# Patient Record
Sex: Female | Born: 1970 | Race: White | Hispanic: No | Marital: Married | Smoking: Current every day smoker
Health system: Southern US, Community
[De-identification: ages and names within clinical notes are randomized; demographics above are authoritative.]

## PROBLEM LIST (undated history)

## (undated) DIAGNOSIS — M1990 Unspecified osteoarthritis, unspecified site: Secondary | ICD-10-CM

## (undated) DIAGNOSIS — G8929 Other chronic pain: Secondary | ICD-10-CM

## (undated) DIAGNOSIS — G43119 Migraine with aura, intractable, without status migrainosus: Principal | ICD-10-CM

## (undated) DIAGNOSIS — Z9289 Personal history of other medical treatment: Secondary | ICD-10-CM

## (undated) DIAGNOSIS — I10 Essential (primary) hypertension: Secondary | ICD-10-CM

## (undated) DIAGNOSIS — K219 Gastro-esophageal reflux disease without esophagitis: Secondary | ICD-10-CM

## (undated) DIAGNOSIS — J189 Pneumonia, unspecified organism: Secondary | ICD-10-CM

## (undated) DIAGNOSIS — R3915 Urgency of urination: Secondary | ICD-10-CM

## (undated) DIAGNOSIS — F319 Bipolar disorder, unspecified: Secondary | ICD-10-CM

## (undated) DIAGNOSIS — G43009 Migraine without aura, not intractable, without status migrainosus: Secondary | ICD-10-CM

## (undated) DIAGNOSIS — M797 Fibromyalgia: Secondary | ICD-10-CM

## (undated) DIAGNOSIS — R51 Headache: Secondary | ICD-10-CM

## (undated) DIAGNOSIS — E669 Obesity, unspecified: Secondary | ICD-10-CM

## (undated) DIAGNOSIS — J329 Chronic sinusitis, unspecified: Secondary | ICD-10-CM

## (undated) DIAGNOSIS — G4733 Obstructive sleep apnea (adult) (pediatric): Secondary | ICD-10-CM

## (undated) DIAGNOSIS — D649 Anemia, unspecified: Secondary | ICD-10-CM

## (undated) DIAGNOSIS — N390 Urinary tract infection, site not specified: Secondary | ICD-10-CM

## (undated) DIAGNOSIS — E119 Type 2 diabetes mellitus without complications: Secondary | ICD-10-CM

## (undated) DIAGNOSIS — R7303 Prediabetes: Secondary | ICD-10-CM

## (undated) DIAGNOSIS — R413 Other amnesia: Secondary | ICD-10-CM

## (undated) DIAGNOSIS — J45909 Unspecified asthma, uncomplicated: Secondary | ICD-10-CM

## (undated) DIAGNOSIS — T8859XA Other complications of anesthesia, initial encounter: Secondary | ICD-10-CM

## (undated) DIAGNOSIS — R519 Headache, unspecified: Secondary | ICD-10-CM

## (undated) DIAGNOSIS — F419 Anxiety disorder, unspecified: Secondary | ICD-10-CM

## (undated) DIAGNOSIS — K5909 Other constipation: Secondary | ICD-10-CM

## (undated) DIAGNOSIS — T4145XA Adverse effect of unspecified anesthetic, initial encounter: Secondary | ICD-10-CM

## (undated) DIAGNOSIS — R339 Retention of urine, unspecified: Secondary | ICD-10-CM

## (undated) DIAGNOSIS — E7800 Pure hypercholesterolemia, unspecified: Secondary | ICD-10-CM

## (undated) DIAGNOSIS — M549 Dorsalgia, unspecified: Secondary | ICD-10-CM

## (undated) DIAGNOSIS — J42 Unspecified chronic bronchitis: Secondary | ICD-10-CM

## (undated) DIAGNOSIS — I209 Angina pectoris, unspecified: Secondary | ICD-10-CM

## (undated) DIAGNOSIS — R32 Unspecified urinary incontinence: Secondary | ICD-10-CM

## (undated) HISTORY — PX: FRACTURE SURGERY: SHX138

## (undated) HISTORY — DX: Obstructive sleep apnea (adult) (pediatric): G47.33

## (undated) HISTORY — PX: STRABISMUS SURGERY: SHX218

## (undated) HISTORY — PX: MULTIPLE TOOTH EXTRACTIONS: SHX2053

## (undated) HISTORY — DX: Gastro-esophageal reflux disease without esophagitis: K21.9

## (undated) HISTORY — DX: Migraine without aura, not intractable, without status migrainosus: G43.009

## (undated) HISTORY — DX: Obesity, unspecified: E66.9

## (undated) HISTORY — DX: Unspecified asthma, uncomplicated: J45.909

## (undated) HISTORY — PX: SHOULDER SURGERY: SHX246

## (undated) HISTORY — PX: FOOT SURGERY: SHX648

## (undated) HISTORY — DX: Migraine with aura, intractable, without status migrainosus: G43.119

## (undated) HISTORY — PX: CARPAL TUNNEL RELEASE: SHX101

## (undated) HISTORY — PX: KNEE ARTHROSCOPY: SHX127

## (undated) HISTORY — PX: ORIF CLAVICULAR FRACTURE: SHX5055

---
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
## 1990-11-24 HISTORY — PX: TUBAL LIGATION: SHX77

## 1998-04-23 ENCOUNTER — Encounter: Admission: RE | Admit: 1998-04-23 | Discharge: 1998-04-23 | Payer: Self-pay | Admitting: Family Medicine

## 1998-05-07 ENCOUNTER — Encounter: Admission: RE | Admit: 1998-05-07 | Discharge: 1998-05-07 | Payer: Self-pay | Admitting: Family Medicine

## 1998-05-08 ENCOUNTER — Encounter: Admission: RE | Admit: 1998-05-08 | Discharge: 1998-05-08 | Payer: Self-pay | Admitting: Sports Medicine

## 1998-05-16 ENCOUNTER — Ambulatory Visit (HOSPITAL_COMMUNITY): Admission: RE | Admit: 1998-05-16 | Discharge: 1998-05-16 | Payer: Self-pay | Admitting: Gastroenterology

## 1998-06-05 ENCOUNTER — Emergency Department (HOSPITAL_COMMUNITY): Admission: EM | Admit: 1998-06-05 | Discharge: 1998-06-05 | Payer: Self-pay | Admitting: Emergency Medicine

## 1998-12-16 ENCOUNTER — Emergency Department (HOSPITAL_COMMUNITY): Admission: EM | Admit: 1998-12-16 | Discharge: 1998-12-16 | Payer: Self-pay | Admitting: *Deleted

## 1999-09-30 ENCOUNTER — Emergency Department (HOSPITAL_COMMUNITY): Admission: EM | Admit: 1999-09-30 | Discharge: 1999-09-30 | Payer: Self-pay | Admitting: Emergency Medicine

## 2000-04-21 ENCOUNTER — Emergency Department (HOSPITAL_COMMUNITY): Admission: EM | Admit: 2000-04-21 | Discharge: 2000-04-21 | Payer: Self-pay | Admitting: Emergency Medicine

## 2000-04-24 ENCOUNTER — Emergency Department (HOSPITAL_COMMUNITY): Admission: EM | Admit: 2000-04-24 | Discharge: 2000-04-24 | Payer: Self-pay | Admitting: Emergency Medicine

## 2000-05-01 ENCOUNTER — Emergency Department (HOSPITAL_COMMUNITY): Admission: EM | Admit: 2000-05-01 | Discharge: 2000-05-01 | Payer: Self-pay | Admitting: Emergency Medicine

## 2000-06-03 ENCOUNTER — Emergency Department (HOSPITAL_COMMUNITY): Admission: EM | Admit: 2000-06-03 | Discharge: 2000-06-03 | Payer: Self-pay | Admitting: Emergency Medicine

## 2000-06-24 ENCOUNTER — Emergency Department (HOSPITAL_COMMUNITY): Admission: EM | Admit: 2000-06-24 | Discharge: 2000-06-24 | Payer: Self-pay | Admitting: *Deleted

## 2000-06-24 ENCOUNTER — Encounter: Payer: Self-pay | Admitting: *Deleted

## 2000-07-25 HISTORY — PX: UVULOPALATOPHARYNGOPLASTY: SHX827

## 2000-09-10 ENCOUNTER — Encounter: Admission: RE | Admit: 2000-09-10 | Discharge: 2000-09-10 | Payer: Self-pay | Admitting: Otolaryngology

## 2000-09-10 ENCOUNTER — Encounter: Payer: Self-pay | Admitting: Otolaryngology

## 2000-11-03 ENCOUNTER — Emergency Department (HOSPITAL_COMMUNITY): Admission: EM | Admit: 2000-11-03 | Discharge: 2000-11-03 | Payer: Self-pay | Admitting: Emergency Medicine

## 2000-11-24 HISTORY — PX: LAPAROSCOPIC CHOLECYSTECTOMY: SUR755

## 2001-01-21 ENCOUNTER — Emergency Department (HOSPITAL_COMMUNITY): Admission: EM | Admit: 2001-01-21 | Discharge: 2001-01-21 | Payer: Self-pay | Admitting: Emergency Medicine

## 2001-01-21 ENCOUNTER — Encounter: Payer: Self-pay | Admitting: Emergency Medicine

## 2001-01-28 ENCOUNTER — Encounter: Admission: RE | Admit: 2001-01-28 | Discharge: 2001-03-30 | Payer: Self-pay | Admitting: Orthopedic Surgery

## 2001-03-09 ENCOUNTER — Ambulatory Visit (HOSPITAL_COMMUNITY): Admission: RE | Admit: 2001-03-09 | Discharge: 2001-03-09 | Payer: Self-pay | Admitting: Orthopedic Surgery

## 2001-03-09 ENCOUNTER — Encounter: Payer: Self-pay | Admitting: Orthopedic Surgery

## 2001-03-16 ENCOUNTER — Ambulatory Visit (HOSPITAL_BASED_OUTPATIENT_CLINIC_OR_DEPARTMENT_OTHER): Admission: RE | Admit: 2001-03-16 | Discharge: 2001-03-16 | Payer: Self-pay | Admitting: Otolaryngology

## 2001-05-13 ENCOUNTER — Encounter: Admission: RE | Admit: 2001-05-13 | Discharge: 2001-07-24 | Payer: Self-pay | Admitting: Anesthesiology

## 2001-06-01 ENCOUNTER — Encounter: Payer: Self-pay | Admitting: Otolaryngology

## 2001-06-03 ENCOUNTER — Encounter (INDEPENDENT_AMBULATORY_CARE_PROVIDER_SITE_OTHER): Payer: Self-pay | Admitting: Specialist

## 2001-06-03 ENCOUNTER — Ambulatory Visit (HOSPITAL_COMMUNITY): Admission: RE | Admit: 2001-06-03 | Discharge: 2001-06-04 | Payer: Self-pay | Admitting: Otolaryngology

## 2001-06-03 ENCOUNTER — Encounter: Payer: Self-pay | Admitting: Otolaryngology

## 2001-06-15 ENCOUNTER — Emergency Department (HOSPITAL_COMMUNITY): Admission: EM | Admit: 2001-06-15 | Discharge: 2001-06-15 | Payer: Self-pay | Admitting: Emergency Medicine

## 2001-08-18 ENCOUNTER — Emergency Department (HOSPITAL_COMMUNITY): Admission: EM | Admit: 2001-08-18 | Discharge: 2001-08-18 | Payer: Self-pay | Admitting: Emergency Medicine

## 2001-08-18 ENCOUNTER — Encounter: Payer: Self-pay | Admitting: Emergency Medicine

## 2001-12-01 ENCOUNTER — Encounter: Admission: RE | Admit: 2001-12-01 | Discharge: 2001-12-01 | Payer: Self-pay | Admitting: Orthopedic Surgery

## 2001-12-01 ENCOUNTER — Encounter: Payer: Self-pay | Admitting: Orthopedic Surgery

## 2001-12-07 ENCOUNTER — Encounter: Admission: RE | Admit: 2001-12-07 | Discharge: 2002-01-12 | Payer: Self-pay | Admitting: Orthopedic Surgery

## 2002-05-19 ENCOUNTER — Encounter: Payer: Self-pay | Admitting: Gastroenterology

## 2002-05-19 ENCOUNTER — Encounter: Admission: RE | Admit: 2002-05-19 | Discharge: 2002-05-19 | Payer: Self-pay | Admitting: Gastroenterology

## 2002-06-06 ENCOUNTER — Emergency Department (HOSPITAL_COMMUNITY): Admission: EM | Admit: 2002-06-06 | Discharge: 2002-06-06 | Payer: Self-pay | Admitting: Emergency Medicine

## 2002-06-06 ENCOUNTER — Encounter: Payer: Self-pay | Admitting: *Deleted

## 2002-06-15 ENCOUNTER — Encounter: Payer: Self-pay | Admitting: Surgery

## 2002-06-15 ENCOUNTER — Encounter: Admission: RE | Admit: 2002-06-15 | Discharge: 2002-06-15 | Payer: Self-pay | Admitting: Surgery

## 2002-08-26 ENCOUNTER — Emergency Department (HOSPITAL_COMMUNITY): Admission: EM | Admit: 2002-08-26 | Discharge: 2002-08-26 | Payer: Self-pay | Admitting: Emergency Medicine

## 2002-08-30 ENCOUNTER — Encounter: Payer: Self-pay | Admitting: Emergency Medicine

## 2002-08-30 ENCOUNTER — Emergency Department (HOSPITAL_COMMUNITY): Admission: EM | Admit: 2002-08-30 | Discharge: 2002-08-30 | Payer: Self-pay | Admitting: Emergency Medicine

## 2002-09-21 ENCOUNTER — Encounter: Payer: Self-pay | Admitting: Emergency Medicine

## 2002-09-21 ENCOUNTER — Emergency Department (HOSPITAL_COMMUNITY): Admission: EM | Admit: 2002-09-21 | Discharge: 2002-09-21 | Payer: Self-pay | Admitting: Emergency Medicine

## 2002-10-25 ENCOUNTER — Encounter: Payer: Self-pay | Admitting: Emergency Medicine

## 2002-10-25 ENCOUNTER — Emergency Department (HOSPITAL_COMMUNITY): Admission: EM | Admit: 2002-10-25 | Discharge: 2002-10-25 | Payer: Self-pay | Admitting: Emergency Medicine

## 2002-11-26 ENCOUNTER — Emergency Department (HOSPITAL_COMMUNITY): Admission: EM | Admit: 2002-11-26 | Discharge: 2002-11-26 | Payer: Self-pay | Admitting: Emergency Medicine

## 2002-11-29 ENCOUNTER — Encounter: Payer: Self-pay | Admitting: Surgery

## 2002-11-29 ENCOUNTER — Encounter: Admission: RE | Admit: 2002-11-29 | Discharge: 2002-11-29 | Payer: Self-pay | Admitting: Surgery

## 2002-12-06 ENCOUNTER — Encounter: Payer: Self-pay | Admitting: Surgery

## 2002-12-06 ENCOUNTER — Encounter: Admission: RE | Admit: 2002-12-06 | Discharge: 2002-12-06 | Payer: Self-pay | Admitting: Surgery

## 2003-01-31 ENCOUNTER — Ambulatory Visit (HOSPITAL_COMMUNITY): Admission: RE | Admit: 2003-01-31 | Discharge: 2003-01-31 | Payer: Self-pay | Admitting: Gastroenterology

## 2003-07-07 ENCOUNTER — Encounter (INDEPENDENT_AMBULATORY_CARE_PROVIDER_SITE_OTHER): Payer: Self-pay | Admitting: Specialist

## 2003-07-07 ENCOUNTER — Observation Stay (HOSPITAL_COMMUNITY): Admission: RE | Admit: 2003-07-07 | Discharge: 2003-07-08 | Payer: Self-pay | Admitting: *Deleted

## 2003-07-07 ENCOUNTER — Encounter: Payer: Self-pay | Admitting: Surgery

## 2003-07-22 ENCOUNTER — Emergency Department (HOSPITAL_COMMUNITY): Admission: EM | Admit: 2003-07-22 | Discharge: 2003-07-22 | Payer: Self-pay

## 2003-09-25 ENCOUNTER — Emergency Department (HOSPITAL_COMMUNITY): Admission: AD | Admit: 2003-09-25 | Discharge: 2003-09-25 | Payer: Self-pay | Admitting: Family Medicine

## 2003-10-10 ENCOUNTER — Emergency Department (HOSPITAL_COMMUNITY): Admission: AD | Admit: 2003-10-10 | Discharge: 2003-10-10 | Payer: Self-pay | Admitting: Family Medicine

## 2003-10-30 ENCOUNTER — Encounter: Admission: RE | Admit: 2003-10-30 | Discharge: 2003-10-30 | Payer: Self-pay | Admitting: Allergy and Immunology

## 2004-03-01 ENCOUNTER — Emergency Department (HOSPITAL_COMMUNITY): Admission: EM | Admit: 2004-03-01 | Discharge: 2004-03-01 | Payer: Self-pay | Admitting: *Deleted

## 2004-09-09 ENCOUNTER — Emergency Department (HOSPITAL_COMMUNITY): Admission: EM | Admit: 2004-09-09 | Discharge: 2004-09-09 | Payer: Self-pay | Admitting: Emergency Medicine

## 2005-02-07 ENCOUNTER — Emergency Department (HOSPITAL_COMMUNITY): Admission: EM | Admit: 2005-02-07 | Discharge: 2005-02-07 | Payer: Self-pay | Admitting: Family Medicine

## 2005-03-09 ENCOUNTER — Emergency Department (HOSPITAL_COMMUNITY): Admission: AD | Admit: 2005-03-09 | Discharge: 2005-03-09 | Payer: Self-pay | Admitting: Family Medicine

## 2005-03-16 ENCOUNTER — Emergency Department (HOSPITAL_COMMUNITY): Admission: EM | Admit: 2005-03-16 | Discharge: 2005-03-16 | Payer: Self-pay | Admitting: Family Medicine

## 2005-04-16 ENCOUNTER — Encounter: Admission: RE | Admit: 2005-04-16 | Discharge: 2005-04-16 | Payer: Self-pay | Admitting: *Deleted

## 2005-06-10 ENCOUNTER — Emergency Department (HOSPITAL_COMMUNITY): Admission: EM | Admit: 2005-06-10 | Discharge: 2005-06-10 | Payer: Self-pay | Admitting: Emergency Medicine

## 2005-08-08 ENCOUNTER — Encounter: Admission: RE | Admit: 2005-08-08 | Discharge: 2005-08-08 | Payer: Self-pay | Admitting: Allergy and Immunology

## 2005-08-23 ENCOUNTER — Emergency Department (HOSPITAL_COMMUNITY): Admission: EM | Admit: 2005-08-23 | Discharge: 2005-08-23 | Payer: Self-pay | Admitting: Emergency Medicine

## 2005-09-21 ENCOUNTER — Emergency Department (HOSPITAL_COMMUNITY): Admission: EM | Admit: 2005-09-21 | Discharge: 2005-09-21 | Payer: Self-pay | Admitting: Emergency Medicine

## 2005-10-18 ENCOUNTER — Emergency Department (HOSPITAL_COMMUNITY): Admission: EM | Admit: 2005-10-18 | Discharge: 2005-10-18 | Payer: Self-pay | Admitting: Emergency Medicine

## 2005-12-19 ENCOUNTER — Emergency Department (HOSPITAL_COMMUNITY): Admission: EM | Admit: 2005-12-19 | Discharge: 2005-12-19 | Payer: Self-pay | Admitting: Family Medicine

## 2006-04-27 ENCOUNTER — Emergency Department (HOSPITAL_COMMUNITY): Admission: EM | Admit: 2006-04-27 | Discharge: 2006-04-27 | Payer: Self-pay | Admitting: Emergency Medicine

## 2006-04-29 ENCOUNTER — Encounter (INDEPENDENT_AMBULATORY_CARE_PROVIDER_SITE_OTHER): Payer: Self-pay | Admitting: *Deleted

## 2006-04-29 ENCOUNTER — Ambulatory Visit: Payer: Self-pay | Admitting: Internal Medicine

## 2006-04-29 ENCOUNTER — Inpatient Hospital Stay (HOSPITAL_COMMUNITY): Admission: EM | Admit: 2006-04-29 | Discharge: 2006-05-06 | Payer: Self-pay | Admitting: Emergency Medicine

## 2006-04-30 ENCOUNTER — Encounter (INDEPENDENT_AMBULATORY_CARE_PROVIDER_SITE_OTHER): Payer: Self-pay | Admitting: *Deleted

## 2006-05-04 ENCOUNTER — Encounter: Payer: Self-pay | Admitting: Internal Medicine

## 2006-07-29 ENCOUNTER — Emergency Department (HOSPITAL_COMMUNITY): Admission: EM | Admit: 2006-07-29 | Discharge: 2006-07-29 | Payer: Self-pay | Admitting: Emergency Medicine

## 2006-08-18 ENCOUNTER — Emergency Department (HOSPITAL_COMMUNITY): Admission: EM | Admit: 2006-08-18 | Discharge: 2006-08-18 | Payer: Self-pay | Admitting: Family Medicine

## 2006-10-11 ENCOUNTER — Emergency Department (HOSPITAL_COMMUNITY): Admission: EM | Admit: 2006-10-11 | Discharge: 2006-10-11 | Payer: Self-pay | Admitting: Emergency Medicine

## 2006-10-13 ENCOUNTER — Ambulatory Visit: Payer: Self-pay | Admitting: Internal Medicine

## 2006-10-20 ENCOUNTER — Ambulatory Visit: Payer: Self-pay | Admitting: Internal Medicine

## 2006-10-27 ENCOUNTER — Ambulatory Visit: Admission: RE | Admit: 2006-10-27 | Discharge: 2006-10-27 | Payer: Self-pay | Admitting: Internal Medicine

## 2006-10-27 ENCOUNTER — Encounter (INDEPENDENT_AMBULATORY_CARE_PROVIDER_SITE_OTHER): Payer: Self-pay | Admitting: *Deleted

## 2006-10-27 ENCOUNTER — Ambulatory Visit: Payer: Self-pay | Admitting: Internal Medicine

## 2006-12-01 ENCOUNTER — Ambulatory Visit: Payer: Self-pay | Admitting: Internal Medicine

## 2007-08-13 ENCOUNTER — Ambulatory Visit: Payer: Self-pay | Admitting: Internal Medicine

## 2008-08-09 ENCOUNTER — Emergency Department (HOSPITAL_COMMUNITY): Admission: EM | Admit: 2008-08-09 | Discharge: 2008-08-09 | Payer: Self-pay | Admitting: Emergency Medicine

## 2008-11-05 ENCOUNTER — Emergency Department (HOSPITAL_COMMUNITY): Admission: EM | Admit: 2008-11-05 | Discharge: 2008-11-05 | Payer: Self-pay | Admitting: Emergency Medicine

## 2009-04-04 ENCOUNTER — Encounter: Admission: RE | Admit: 2009-04-04 | Discharge: 2009-05-04 | Payer: Self-pay | Admitting: Orthopedic Surgery

## 2009-04-25 ENCOUNTER — Encounter: Admission: RE | Admit: 2009-04-25 | Discharge: 2009-04-25 | Payer: Self-pay | Admitting: Nephrology

## 2009-04-26 ENCOUNTER — Emergency Department (HOSPITAL_COMMUNITY): Admission: EM | Admit: 2009-04-26 | Discharge: 2009-04-26 | Payer: Self-pay | Admitting: Emergency Medicine

## 2009-05-28 ENCOUNTER — Emergency Department (HOSPITAL_COMMUNITY): Admission: EM | Admit: 2009-05-28 | Discharge: 2009-05-28 | Payer: Self-pay | Admitting: Emergency Medicine

## 2009-06-01 ENCOUNTER — Encounter
Admission: RE | Admit: 2009-06-01 | Discharge: 2009-06-05 | Payer: Self-pay | Admitting: Physical Medicine & Rehabilitation

## 2009-06-05 ENCOUNTER — Ambulatory Visit: Payer: Self-pay | Admitting: Physical Medicine & Rehabilitation

## 2009-06-18 ENCOUNTER — Emergency Department (HOSPITAL_COMMUNITY): Admission: EM | Admit: 2009-06-18 | Discharge: 2009-06-18 | Payer: Self-pay | Admitting: Emergency Medicine

## 2009-07-10 ENCOUNTER — Encounter: Admission: RE | Admit: 2009-07-10 | Discharge: 2009-07-10 | Payer: Self-pay | Admitting: Nephrology

## 2009-10-22 ENCOUNTER — Ambulatory Visit (HOSPITAL_BASED_OUTPATIENT_CLINIC_OR_DEPARTMENT_OTHER): Admission: RE | Admit: 2009-10-22 | Discharge: 2009-10-22 | Payer: Self-pay | Admitting: Orthopedic Surgery

## 2009-12-21 ENCOUNTER — Ambulatory Visit (HOSPITAL_BASED_OUTPATIENT_CLINIC_OR_DEPARTMENT_OTHER): Admission: RE | Admit: 2009-12-21 | Discharge: 2009-12-21 | Payer: Self-pay | Admitting: Orthopedic Surgery

## 2010-01-01 ENCOUNTER — Encounter: Admission: RE | Admit: 2010-01-01 | Discharge: 2010-01-01 | Payer: Self-pay | Admitting: Neurology

## 2010-01-01 ENCOUNTER — Encounter: Admission: RE | Admit: 2010-01-01 | Discharge: 2010-01-01 | Payer: Self-pay | Admitting: Allergy and Immunology

## 2010-04-24 ENCOUNTER — Encounter: Admission: RE | Admit: 2010-04-24 | Discharge: 2010-04-24 | Payer: Self-pay | Admitting: Allergy and Immunology

## 2010-07-01 ENCOUNTER — Emergency Department (HOSPITAL_COMMUNITY): Admission: EM | Admit: 2010-07-01 | Discharge: 2010-07-02 | Payer: Self-pay | Admitting: Emergency Medicine

## 2010-12-15 ENCOUNTER — Encounter: Payer: Self-pay | Admitting: Allergy and Immunology

## 2011-01-21 ENCOUNTER — Ambulatory Visit: Payer: Medicaid Other | Admitting: Physical Therapy

## 2011-01-22 ENCOUNTER — Ambulatory Visit: Payer: Medicaid Other | Admitting: Physical Therapy

## 2011-01-30 ENCOUNTER — Ambulatory Visit: Payer: Medicaid Other | Attending: Nephrology | Admitting: Physical Therapy

## 2011-01-30 DIAGNOSIS — IMO0001 Reserved for inherently not codable concepts without codable children: Secondary | ICD-10-CM | POA: Insufficient documentation

## 2011-01-30 DIAGNOSIS — M545 Low back pain, unspecified: Secondary | ICD-10-CM | POA: Insufficient documentation

## 2011-02-09 LAB — POCT I-STAT, CHEM 8
BUN: 6 mg/dL (ref 6–23)
Calcium, Ion: 1.1 mmol/L — ABNORMAL LOW (ref 1.12–1.32)
Chloride: 101 mEq/L (ref 96–112)
Creatinine, Ser: 0.8 mg/dL (ref 0.4–1.2)
Glucose, Bld: 150 mg/dL — ABNORMAL HIGH (ref 70–99)
HCT: 42 % (ref 36.0–46.0)
Hemoglobin: 14.3 g/dL (ref 12.0–15.0)
Potassium: 3.3 mEq/L — ABNORMAL LOW (ref 3.5–5.1)
Sodium: 139 mEq/L (ref 135–145)
TCO2: 32 mmol/L (ref 0–100)

## 2011-02-26 LAB — POCT I-STAT, CHEM 8
BUN: 9 mg/dL (ref 6–23)
Calcium, Ion: 1.21 mmol/L (ref 1.12–1.32)
Chloride: 98 mEq/L (ref 96–112)
Creatinine, Ser: 0.8 mg/dL (ref 0.4–1.2)
Glucose, Bld: 119 mg/dL — ABNORMAL HIGH (ref 70–99)
HCT: 44 % (ref 36.0–46.0)
Hemoglobin: 15 g/dL (ref 12.0–15.0)
Potassium: 3.9 mEq/L (ref 3.5–5.1)
Sodium: 138 mEq/L (ref 135–145)
TCO2: 30 mmol/L (ref 0–100)

## 2011-03-13 ENCOUNTER — Other Ambulatory Visit (HOSPITAL_COMMUNITY): Payer: Self-pay | Admitting: Nephrology

## 2011-03-13 DIAGNOSIS — R112 Nausea with vomiting, unspecified: Secondary | ICD-10-CM

## 2011-04-01 ENCOUNTER — Encounter (HOSPITAL_COMMUNITY)
Admission: RE | Admit: 2011-04-01 | Discharge: 2011-04-01 | Disposition: A | Discharge status: Home / Self Care | Payer: Medicaid Other | Source: Ambulatory Visit | Attending: Nephrology | Admitting: Nephrology

## 2011-04-01 ENCOUNTER — Encounter (HOSPITAL_COMMUNITY): Payer: Self-pay

## 2011-04-01 DIAGNOSIS — Z9089 Acquired absence of other organs: Secondary | ICD-10-CM | POA: Insufficient documentation

## 2011-04-01 DIAGNOSIS — R112 Nausea with vomiting, unspecified: Secondary | ICD-10-CM

## 2011-04-01 MED ORDER — TECHNETIUM TC 99M SULFUR COLLOID
2.0000 | Freq: Once | INTRAVENOUS | Status: AC | PRN
  Administered 2011-04-01: 2 via ORAL

## 2011-04-03 ENCOUNTER — Other Ambulatory Visit (HOSPITAL_COMMUNITY): Payer: Medicaid Other

## 2011-04-04 ENCOUNTER — Ambulatory Visit (HOSPITAL_COMMUNITY)
Admission: RE | Admit: 2011-04-04 | Discharge: 2011-04-04 | Disposition: A | Discharge status: Home / Self Care | Payer: Medicaid Other | Source: Ambulatory Visit | Attending: Nephrology | Admitting: Nephrology

## 2011-04-04 DIAGNOSIS — R112 Nausea with vomiting, unspecified: Secondary | ICD-10-CM | POA: Insufficient documentation

## 2011-04-04 DIAGNOSIS — R109 Unspecified abdominal pain: Secondary | ICD-10-CM | POA: Insufficient documentation

## 2011-04-04 DIAGNOSIS — K5900 Constipation, unspecified: Secondary | ICD-10-CM | POA: Insufficient documentation

## 2011-04-08 NOTE — Assessment & Plan Note (Signed)
[facility]                             PULMONARY OFFICE NOTE   NAME:FRANKLIN, AMYLAH WILL                    MRN:          [id number]  DATE:08/13/2007                            DOB:          25-Feb-1971    HISTORY:  A 40 year old white female who works the Production designer, theatre/television/film and was  last seen her on January 8th for evaluation of a chronic cough and  recurrent pneumonia with a right upper lobe density that had been  biopsied that proved inflammatory in nature. She has not received any  follow up because she has been  on the road but comes in today acutely  ill for the last 3 days with a hacking cough associated with sore throat  that is worse when she tries to cough but has not noticed any change in  chronic black mucous for the last several months, typically is worse  when she lies down and early in the morning. She denies any hemoptysis  or lateralizing pleuritic pain, dyspnea over baseline, fevers, chills,  sweats, orthopnea, PND, or leg swelling, or unintended weight loss.   Her medications at present consist of Nexium twice day although not  consistently before meals. She says albuterol does not work as well as  Proventil on her breathing and could she have some Tussionex.   PHYSICAL EXAMINATION:  She is an animated white female who has a  difficult time answering questions in a straight forward fashion. She is  afebrile, stable vital signs.  HEENT: Unremarkable, pharynx clear.  LUNG FIELDS: Actually clear bilaterally to auscultation and percussion  with prominent pseudo wheeze.  There is a regular rate and rhythm without murmur, gallop, or rub.  ABDOMEN: Soft, benign.  EXTREMITIES: Warm without calf tenderness, cyanosis, clubbing, or edema.   IMPRESSION:  This patient predominantly has upper airway findings that  suggest either rhinitis, reflux or both. I am going to recommend that  the Nexium be taken perfectly before meals and reviewed with her a  reflux diet. I also recommended Mucinex DM 2 b.i.d. with tramadol 50 mg  1 q.4 p.r.n. instead of Tussionex.   She is already on a tapering course of prednisone for her back pain  which I have asked her to continue off of. I have given her doxicycline  100mg  b.i.d. for 7 days and we will repeat a chest x-ray today.   She tells me she is on her way out of town today but will return in  November for follow up.   In the meantime I have pleaded with her to commit to smoking cessation  emphasizing to her that she presently does not have any clear evidence  of lung disease from smoking but ultimately  is very high risk of developing it if she continues on her present trend  (heavy smoking with no regular pulmonary follow up).     Charlaine Dalton. Sherene Sires, MD, [facility]  Electronically Signed    MBW/MedQ  DD: 08/13/2007  DT: 08/13/2007  Job #: [id number]   cc:   Jessica Priest, M.D.
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
## 2011-04-09 ENCOUNTER — Other Ambulatory Visit: Payer: Self-pay | Admitting: Nephrology

## 2011-04-09 ENCOUNTER — Ambulatory Visit
Admission: RE | Admit: 2011-04-09 | Discharge: 2011-04-09 | Disposition: A | Discharge status: Home / Self Care | Payer: Medicaid Other | Source: Ambulatory Visit | Attending: Nephrology | Admitting: Nephrology

## 2011-04-09 DIAGNOSIS — F17200 Nicotine dependence, unspecified, uncomplicated: Secondary | ICD-10-CM
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
## 2011-04-11 NOTE — Op Note (Signed)
NAME:  [name] [name], [name] [name]                       ACCOUNT NO.:  [id number]   MEDICAL RECORD NO.:  [id number]                   PATIENT TYPE:  OBV   LOCATION:  0471                                 FACILITY:  [facility]   PHYSICIAN:  Jamison Neighbor, M.D.               DATE OF BIRTH:  Nov 22, 1971   DATE OF PROCEDURE:  07/07/2003  DATE OF DISCHARGE:                                 OPERATIVE REPORT   SERVICE:  Urology.   PREOPERATIVE DIAGNOSES:  Chronic urgency frequency, possible interstitial  cystitis.   POSTOPERATIVE DIAGNOSES:  Chronic urgency frequency, possible interstitial  cystitis.   PROCEDURE:  Cystoscopy, urethral calibration, hydrodistention of the  bladder, Marcaine and Pyridium installation, Marcaine and Kenalog injection.   SURGEON:  Jamison Neighbor, M.D.   ANESTHESIA:  General.   COMPLICATIONS:  None.   DRAINS:  None.   BRIEF HISTORY:  This 40 year old female is to undergo cystoscopy and  hydrodistention as part of an evaluation for possible interstitial cystitis.  The patient had numerous illnesses associated with this condition including  fibromyalgia, breathing problems, migraines and probable irritable bowel  syndrome. The patient is scheduled to undergo a cholecystectomy as well as  diagnostic laparoscopy. We feel that the patient's symptoms are very  consistent with interstitial cystitis and need to confirm that diagnosis.  The patient understands the risks and benefits of the procedure and gave  full and informed consent.   DESCRIPTION OF PROCEDURE:  After successful induction of general anesthesia,  the patient was placed in the dorsal lithotomy position, prepped with  Betadine and draped in the usual sterile fashion. Cystoscopy was performed.  The urethra was calibrated to 9 [place] with female urethral sounds with no  signs of stenosis or stricture. The cystoscope was inserted, the bladder was  carefully inspected and was free of any tumor or stones. There  was no sign  of any endometrial implants. The bladder was distended at a pressure of 100  cm of water for five minutes and when the bladder was drained, there was a  very minimal terminal blood tinge. The number of glomerulations were quite  modest but a few were seen. There were no Hunner's ulcer. The patient had a  bladder capacity of 1000 mL. We would compare this to a normal bladder  capacity of 1100 or above and the average for interstitial cystitis of 575.  This clearly would suggest that if the patient does have IC it is very  minimal. A bladder biopsy was taken and sent for mast cell analysis. The  mast cell biopsy site was cauterized. The bladder was drained and a mixture  of Marcaine and Pyridium was left in the bladder, Marcaine and Kenalog were  injected periurethrally as a pudendal nerve block. The patient received a  B&O suppository. She was then reprepped and draped in preparation for  diagnostic laparoscopy and possible hysteroscopy and then she will undergo  her laparoscopic cholecystectomy.                                               Jamison Neighbor, M.D.    RJE/MEDQ  D:  07/07/2003  T:  07/07/2003  Job:  [id number]   cc:   Thorn[place] Park Daphine Deutscher, M.D.  [address]  [place]  [place] [address]  Fax: [phone]   C. Lesia Sago, M.D.  [address]  [place]  [place] [address]  Fax: [phone]   Llana Aliment. Malon Kindle., M.D.  [address]  [place]  [place] [address]  Fax: [phone]   Pershing Cox, M.D.  [address]  [place]  [place] [address]  Fax: [phone]   Jessica Priest, M.D.  [address]  [place] [address]  Fax: [phone]
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
## 2011-04-11 NOTE — Assessment & Plan Note (Signed)
[facility]                             PULMONARY OFFICE NOTE   NAME:[name], [name] MARKET                    MRN:          [id number]  DATE:10/20/2006                            DOB:          [birth year]    HISTORY:  This 40 year old white female, an active smoker, with a right  upper lobe nodule that has been followed since 2005, and for which  previous bronchoscopy was recommended but she was a no-show on December 10, 2003.  She was admitted on April 29, 2006, through May 06, 2006, with  cough and was felt to have a component of pneumonia and was seen by both  myself and by CVTS for evaluation of the right upper lobe nodule.  An  FNA was done, indicating nonspecific changes.  A PET scan showed no  definite increased activity, but she is concerned that she is still  having black sputum every morning with increasing dyspnea and  congestion and wants something done about the spot on her lung.   PHYSICAL EXAMINATION:  GENERAL:  She is a somewhat depressed-appearing  ambulatory obese white female, in no acute distress.  VITAL SIGNS:  HEENT:  Unremarkable.  LUNGS:  Lung fields reveal a few rhonchi bilaterally.  These are  minimal.  HEART:  A regular rhythm without murmur, gallop or rub.  ABDOMEN:  Soft.  EXTREMITIES:  No calf tenderness, cyanosis or clubbing.   A chest x-ray repeated today, again reveals a very peripheral right  apical nodular density that does not appear significantly changed over  the last two years, with no associated air space disease.   IMPRESSION:  I think this patient's main symptoms are related to smoking  not the density seen at the periphery of the lung, which has nothing to  do with either her cough or dyspnea; however, she is convinced that this  is the reason she is getting recurrent pneumonia, and does not appear  to have the insight or motivation to stop smoking, which is the main  issue here.   PLAN:  I think it is  reasonable to perform a bronchoscopy, if for  nothing more than to sample the black sputum, and make sure that she  does not have super-infection, and also that the airways are patent;  however, the main indication for this is to reassure the patient that  there is nothing wrong with her now that cannot be corrected by stopping  smoking, which is again what she really needs to do.  I did discuss with  her the indications in terms of benefits and risks of bronchoscopy,  which she agreed to pursue on October 27, 2006, at [facility].     Casimiro Needle B. Sherene Sires, MD, [facility]  Electronically Signed    MBW/MedQ  DD: 10/21/2006  DT: 10/21/2006  Job #: [id number]

## 2011-04-11 NOTE — Op Note (Signed)
NAMEALYSIANA, [name] [name]             ACCOUNT NO.:  [id number]   MEDICAL RECORD NO.:  [id number]          PATIENT TYPE:  OUT   LOCATION:  CARD                         FACILITY:  [facility]   PHYSICIAN:  Charlaine Dalton. Sherene Sires, MD, FCCPDATE OF BIRTH:  11/01/1971   DATE OF PROCEDURE:  10/27/2006  DATE OF DISCHARGE:                               OPERATIVE REPORT   PROCEDURE:  Fiberoptic bronchoscopy with transbronchial biopsy of the  right upper lobe.   HISTORY AND INDICATIONS:  Please see dictated office records in the e-  chart.  This been no change since the evaluation was done in terms of  either history or exam.  The patient agreed to procedure after full  discussion of risks, benefits and alternatives.   The procedure was performed in the bronchoscopy suite with continuous  monitoring by surface ECG and oximetry.  The patient maintained adequate  saturations throughout procedure, received a total of 100 mg IV Demerol  and 5 mg IV Versed for sedation and cough suppression.   The nasopharynx, oropharynx initially anesthetized with 1% lidocaine by  updraft nebulizer.  An additional 2% lidocaine jelly was administered to  both nares.   Using the left nares, a standard video bronchoscope was easily passed  with good visualization of the entire oropharynx and larynx.  There were  no upper airway abnormalities and the cords moved normally.   Using additional 1% lidocaine as needed, the entire tracheobronchial  tree was explored bilaterally with the following findings.   1. All the airways open widely to the subsegmental level with no      evidence of significant excess secretions or mucosal abnormalities.   PROCEDURE:  Using a wedge position within the apical segment of the  right upper lobe, I was able to pass a transbronchial biopsy forceps in  proximity but not directly into the mass seen apically.  Two biopsies  were obtained with minimal bleeding.   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   RECOMMENDATIONS:  Follow up in the office in 6 weeks and continue  efforts to stop smoking.      Charlaine Dalton. Sherene Sires, MD, [facility]  Electronically Signed     MBW/MEDQ  D:  10/27/2006  T:  10/27/2006  Job:  [id number]

## 2011-04-11 NOTE — Assessment & Plan Note (Signed)
[facility]                             PULMONARY OFFICE NOTE   NAME:FRANKLIN, TENECIA IGNASIAK                    MRN:          [id number]  DATE:12/01/2006                            DOB:          06-Oct-1971    This is a pulmonary/final followup office visit.   HISTORY:  [age]-year-old white female, active smoker with chronic  cough and concern regarding recurrent pneumonia for which she  underwent bronchoscopy on October 27, 2006, showing minimal inflammation  but no excess secretions or mucosal abnormalities.  Cytologies were  negative and transbronchial biopsy of a very peripheral density in the  right upper lobe appeared benign.   She returns today stating she is still having coughing paroxysms to the  point where she hurts in her chest anteriorly and has trouble sleeping  at night.  She is taking high doses of omeprazole for her overt reflux  symptoms with adequate control.   PHYSICAL EXAMINATION:  She is an obese, ambulatory white female who has  gained another 20 pounds since her previous visit, up to 258 now.  HEENT:  Unremarkable, oropharynx is clear.  NECK:  Supple without cervical adenopathy or tenderness.  The trachea  was midline, no thyromegaly.  LUNGS:  Lung fields are perfectly clear bilaterally to auscultation and  percussion.  CARDIAC:  There is a regular rhythm without murmur, gallop or rub.  ABDOMEN:  Soft, benign but obese.  EXTREMITIES:  Warm without calf tenderness, cyanosis, clubbing or edema.   IMPRESSION:  Continued smoking against medical advice is her primary  pulmonary problem, and is best served by first committing to quit  smoking and then asking for whatever help she needs to make it across  that bridge.  She does not really appear to be ready to cross that  bridge however, at this point, and is actually leaving today to go to  [facility] for the next 9 months, and so we will not be seeing her  back here for  counseling (although this certainly is available if she  makes the commitment and is able to stay in town).   Chart review indicates she has a density in the right upper lobe which  has already undergone a CT-guided biopsy and also bronchoscopy with no  evidence of malignancy.  I have recommended a followup chest x-ray in 3  months, but it is not clear whether she will be back here or in [place]  at that point.  I understand also Dr. Tyrone Sage has evaluated this  problem and  is following it independently, but does not feel that excisional biopsy  is necessary, and I certainly would support continued conservative  followup at this point.     Charlaine Dalton. Sherene Sires, MD, [facility]  Electronically Signed    MBW/MedQ  DD: 12/01/2006  DT: 12/02/2006  Job #: [id number]   cc:   Sheliah Plane, MD
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
## 2011-04-11 NOTE — Consult Note (Signed)
[name] [name] NO.:  [id number]   MEDICAL RECORD NO.:  [id number]          PATIENT TYPE:  INP   LOCATION:  5730                         FACILITY:  [facility]   PHYSICIAN:  Antonietta Breach, M.D.  DATE OF BIRTH:  1971/04/21   DATE OF CONSULTATION:  05/06/2006  DATE OF DISCHARGE:                                   CONSULTATION   REQUESTING PHYSICIAN:  Derenda Mis, M.D.   REASON FOR REQUEST:  Anxiety.   [name] is a 40 year old female admitted to [facility] for a right upper lobe lung mass workup.  She had a cough and  abnormal x-ray.  She has recently had pneumonia.   The patient reports chronic feeling on edge and insomnia associated with her  fibromyalgia.  She denies lack of interest or decreased energy.  She has no  thoughts of harming herself or others.  She has no hallucinations, no  delusions.  She has chronic pain associated with her fibromyalgia.   PAST PSYCHIATRIC HISTORY:  No hallucinations, no delusions.  The patient has  a history of multiple suicide attempts, the last one being 7 years prior to  admission.  She also has a history of at least 10 psychiatric  hospitalizations including [facility] and two times at [facility].  The last psychiatric admission was approximately 7 years prior to  admission.  The patient states that she has become much more stable through  counseling and the structure of being a mother for her two children.   The patient was tried on a number of things to stabilize her mood.  During  the time of self-harm and mood swings she was using a lot of illegal drugs  including acid, cocaine, injection of heroin once, Ecstasy.  Psychotropics  that were tried included lithium, Depakote, Prozac, Zoloft, Celexa, Paxil,  Effexor.   The patient states that she was able to become stable through psychotherapy  and Neurontin.  The Neurontin helped with her fibromyalgia as well as her  anxiety.   FAMILY PSYCHIATRIC HISTORY:  Alcoholism in several family members.  The  father committed suicide.  The extent of his psychiatric illness is not  known.   SOCIAL HISTORY:  The patient achieved education up through the ninth grade.  She has a prior divorce and is currently engaged to be married.  She has two  children ages 78 and 35.  Her religion is nondenominational.  She currently  lives with her fiance.  She denies any alcohol use and states that  alcoholism runs in her family.  She still uses marijuana on a regular basis  to help with her pain and anxiety.  She realizes that this has been self-  medication, has a prison record for selling marijuana.   GENERAL MEDICAL HISTORY:  1.  Fibromyalgia.  2.  Asthma.  3.  Gastroesophageal reflux disease.   MEDICATIONS:  MAR reviewed.  Psychotropics include Xanax 0.25 mg a.c. and  h.s. and Ambien 5 mg q.h.s.   DRUG ALLERGIES:  Include:  1.  ASPIRIN.  2.  IBUPROFEN.  3.  VALIUM.  4.  CODEINE.   LABORATORY DATA:  White blood cell count 8.2. hemoglobin 11.9, platelets  268.  INR 0.9.  Complete metabolic panel is unremarkable.  SGOT is 14, SGPT  15.   REVIEW OF SYSTEMS:  CONSTITUTIONAL:  Afebrile.  HEAD:  No trauma.  EYES:  No  visual disturbances.  ENT:  No sore throat.  RESPIRATORY:  No current cough.  CV:  No chest pain.  GASTROINTESTINAL:  No nausea, vomiting, diarrhea.  GENITOURINARY:  No dysuria.  MUSCULOSKELETAL:  No deformities, weaknesses,  or atrophy.  However, the patient does have diffuse chronic joint pain with  a diagnosis of fibromyalgia.  HEMATOLOGIC/LYMPHATIC:  None.  NEURO:  None.  PSYCH:  As above.  ENDOCRINE/METABOLIC:  None.   EXAMINATION:  VITAL SIGNS:  Temperature 98.6, pulse 80, respirations 18,  blood pressure 97/50, oxygen saturation 96% on room air.  MENTAL STATUS EXAMINATION:  [name] is a middle-aged female who is  alert, sitting up in her hospital bed, with mildly anxious affect, good  eye  contact, and appropriate social reciprocity.  Her speech has normal rate and  thought process is logical, coherent, goal directed thought content.  No  thoughts of harming herself.  No thoughts of harming others.  No delusions,  no hallucinations.  She is oriented to all spheres.  Her fund of knowledge  and intelligence are average.  Her judgment is intact, her insight is  partial.  Her concentration is mildly decreased.  Her memory is intact to  immediate, recent, and remote.   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  Polysubstance dependence.  AXIS II:  Cluster B traits.  AXIS III:  See general medical problems above.  AXIS IV:  General medical.  AXIS V:  55.   The patient is not assessed to be at risk to harm self or others.  The  patient agrees to call 911 for distress, thoughts of harming herself, or  thoughts of harming others.   RECOMMENDATION:  1.  Discussed the indications, alternatives and adverse effects of      Neurontin, prior psychotropics that she was tried on, as well as the      benzodiazepine Klonopin and other benzodiazepines.  We also discussed      psychotherapy.  The patient understands the above information and wants      to restart her Neurontin at 300 mg t.i.d. for anti-anxiety as well as      her fibromyalgia pain.  It is anticipated that this will be titrated up      to 900 mg t.i.d. as tolerated.  2.  The patient will proceed with arranged outpatient psychotherapy.  Please      ask the case manager to meet with the patient to arrange this.  Possible      counselors could be a prior therapist of the patient's choice,      counselors at      [employer], or counselors at the [facility].      [facility] also may take Medicaid for the patient's      psychotherapy as well as psychotropic medication. 3.  Alcohol/drug services appointment upon discharge and 12-step  groups.      Antonietta Breach, M.D.  Electronically Signed     JW/MEDQ  D:  05/06/2006  T:  05/06/2006  Job:  [id number]
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
## 2011-04-11 NOTE — Op Note (Signed)
NAME:  [name] [name], [name] [name]                       ACCOUNT NO.:  [id number]   MEDICAL RECORD NO.:  [id number]                   PATIENT TYPE:  AMB   LOCATION:  ENDO                                 FACILITY:  [facility]   PHYSICIAN:  James L. Malon Kindle., M.D.          DATE OF BIRTH:  02-05-1971   DATE OF PROCEDURE:  01/31/2003  DATE OF DISCHARGE:                                 OPERATIVE REPORT   PROCEDURE:  Colonoscopy.   MEDICATIONS:  Fentanyl 150 mcg, Versed 10 mg IV.   ENDOSCOPE:  Olympus pediatric colonoscope.   INDICATIONS:  The patient has an abdominal abscess with plans for possible  laparoscopic cholecystectomy and drainage of this by Pershing Cox,  M.D., and [place]. Daphine Deutscher, M.D.  She had a barium enema done, which  suggested a 12 mm polyp at the hepatic flexure.  It could have been stool,  they were not sure.  This procedure is done to evaluate this area.   DESCRIPTION OF PROCEDURE:  The procedure had been explained to the patient  and consent obtained.  The pediatric scope was inserted and advanced.  The  prep was very marginal.  There were some retained balls of stool with  sticky, adherent stool.  The patient takes medications for migraine  headaches and was very hard to sedate despite a large amount of medicine,  was physically resistant and at times verbally abusive during the procedure.  We were able to use abdominal pressure and position changes and arrived at  the cecum.  The ileocecal valve was seen, as was the appendiceal orifice.  The scope was withdrawn and the cecum, ascending colon, and ascending colon  were seen well.  I did not see a polyp.  I washed the hepatic flexure, and  no gross polyp was seen.  There was sticky stool here.  The transverse  colon, splenic, descending, and sigmoid colon were all seen well and no  diverticular disease.  The rectum was free of polyps as well.  The scope was  withdrawn, and the patient tolerated the procedure without  any obvious  complications.    ASSESSMENT:  Abnormal barium enema  with essentially normal colonoscopy,  suboptimal prep.   PLAN:  Will have the patient follow up with Dr. Daphine Deutscher and Dr. Carey Bullocks, and  will see her back as needed.                                               James L. Malon Kindle., M.D.    Waldron Session  D:  01/31/2003  T:  01/31/2003  Job:  [id number]   cc:   [place] Daphine Deutscher, M.D.  [address]  [place]  [place] [address]  Fax: [phone]   Pershing Cox, M.D.  [address] [address]  [address]  [place]  [place] [address]  Fax: [phone]

## 2011-04-11 NOTE — Op Note (Signed)
[facility]  Patient:    [name] [name], [name] [name]                    MRN: [id number] Adm. Date:  [id number] Attending:  Waldon Merl CC:         Teena Irani. Arlyce Dice, M.D.   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  PROCEDURES: 1. Reduction of turbinates. 2. Reduction of conchae bullosae. 3. Left maxillary sinus ostial enlargement with antrostomy, with removal of    mucous cyst via functional endoscopic sinus surgery. 4. Uvulopalatopharyngoplasty.  SURGEON:  Keturah Barre, M.D.  ANESTHESIA:  General endotracheal with Edwin Cap. Zoila Shutter, M.D.  DESCRIPTION OF PROCEDURE:  Patient placed in supine position.  Under general endotracheal anesthesia, the nose was first approached, where we anesthetized her nose with topical cocaine 200 mg and 1% Xylocaine with epinephrine in an effort to shrink the membranes for better visualization even though she was under general anesthesia.  The inferior turbinate we were able to outfracture quite aggressively and gain a tremendous amount of space.  The middle turbinates, which were conchae bullosae, which are filled with air, were able to also be reduced, where we were able to gain considerably more space as well and also gain space for the sinuses to drain.  With this, she had had some thickening and a mild deviation of her septum and a very small nose.  We were thinking we may need to do this, but I decided that we did not have to do or would not need to do at this time a septal reconstruction.  So with this, we reduced the turbinates,  we reduced the conchae bullosae.  We then via functional endoscopic sinus surgery used the 0 degree scope and the angled Blakesley-Wildes and the side-biting forceps.  We found the natural ostium of the maxillary sinus.  We opened this.  We increased it to approximately five times its normal size.  We also did an antrostomy and through this approach, we removed the mucous cyst.  Once this was achieved, then we placed a Merocel pack on each side and proceeded to the oral cavity.  We were using a tonsillar gag.  We were able to trim the uvula and palate and raise the whole contour of the palate.  Once this was trimmed, then the anterior and posterior mucous membrane was brought into approximation using 5-0 plain catgut and once this was achieved and all hemostasis was established, we then removed the Merocel packs, placed trumpets within the nose, checked this again for any notable bleeding, which there was none, and then we awakened the patient and the patient did very well postop.  The patient will be observed overnight and observed with a pulse oximeter.  Her follow-up will be in one week, three weeks, and six weeks and six months. DD:  06/03/01 TD:  06/03/01 Job: [id number]

## 2011-04-11 NOTE — Op Note (Signed)
NAME:  [name] [name], [name] [name]                       ACCOUNT NO.:  [id number]   MEDICAL RECORD NO.:  [id number]                   PATIENT TYPE:  AMB   LOCATION:  DAY                                  FACILITY:  [facility]   PHYSICIAN:  Pershing Cox, M.D.            DATE OF BIRTH:  December 25, 1970   DATE OF PROCEDURE:  07/07/2003  DATE OF DISCHARGE:                                 OPERATIVE REPORT   PREOPERATIVE DIAGNOSIS:  Pelvic pain.   POSTOPERATIVE DIAGNOSIS:  No evidence of endometriosis.   PROCEDURE:  Examination under anesthesia, dilatation and curettage of the  endometrium, placement of Hulka tenaculum diagnostic laparoscopy.   INDICATIONS FOR PROCEDURE:  This patient is a 40 year old female with a long  history of abdominopelvic pain. She has been seen by many doctors in town  complaining of her abdominopelvic pain and today is scheduled for a  diagnostic laparoscopy and cystoscopy to evaluate her for a possible  interstitial cystitis. Because of her pelvic pain, we agreed to be present  to look at her pelvic peritoneum to see if she has any evidence of  endometriosis.   DESCRIPTION OF PROCEDURE:  Dlisa Barnwell was brought to the operating room  with an IV in place. She received a gram of Ancef in the holding area.  Supine on the OR table, general endotracheal anesthesia was established  without difficulty. She was then prepped for a pelvic procedure and the  urologist, Dr. Logan Bores, performed a cystoscopy. Please see the notes of his  dictation for details of this procedure. Following completion of his  procedure, a bivalve speculum was inserted. A single tooth tenaculum was  placed onto the anterior cervix. The cervix was dilated to a size 15 Pratt  and a small sharp curette was used to curette the endometrial wall serially.  Tissue was removed to be evaluated by pathology. At this point, the  tenaculum was removed, the drapes were removed and the patient was  completely  reprepped for an abdominopelvic procedure. Once this had been  completed and drapes had been placed, bivalve speculum exposed the cervix,  tenaculum was placed on the anterior cervix and a Hulka tenaculum was placed  into the uterus in an anteflex position. A Foley catheter was sterilely  inserted into the bladder.   An incision was made beneath the umbilicus and carried down to the fascia.  The fascia was identified and grasped with Kocher clamps. It was opened. The  peritoneum was palpated and opened digitally. A #0 Vicryl pursestring was  placed around the edges of the fascia and the Hulka tenaculum was secured to  this stitch. A laparoscope was introduced and confirmation of intraabdominal  position was made. Gas was insufflated. The Hulka tenaculum was raised and  the pelvis was completely visualized. Photographs were taken to document a  normal appearing pelvis and ovaries. At this point, Dr. Rolan Bucco entered  the operating room and began  his laparoscopic cholecystectomy. Please see  the copies of his dictation for details of this.                                               Pershing Cox, M.D.    MAJ/MEDQ  D:  07/07/2003  T:  07/07/2003  Job:  [id number]   cc:   Jamison Neighbor, M.D.  [address]  [place]  [place] [address]  Fax: [phone]   [place]. Daphine Deutscher, M.D.  [address], [address]  [place]  [place] [address]  Fax: [phone]   Pershing Cox, M.D.  [address]  [place]  [place] [address]  Fax: [phone]

## 2011-04-11 NOTE — Discharge Summary (Signed)
[name] [name], [name] [name]             ACCOUNT NO.:  [id number]   MEDICAL RECORD NO.:  [id number]          PATIENT TYPE:  INP   LOCATION:  5730                         FACILITY:  [facility]   PHYSICIAN:  Melissa L. Ladona Ridgel, MD  DATE OF BIRTH:  27-Aug-1971   DATE OF ADMISSION:  04/29/2006  DATE OF DISCHARGE:  05/06/2006                                 DISCHARGE SUMMARY   CHIEF COMPLAINT AT ADMISSION:  Cough and abnormal x-ray.   DISCHARGE DIAGNOSES:  1.  Right upper lobe lung nodule of unclear etiology.  The patient underwent      needle-guided CT biopsy which was unremarkable for any significant      pathology except for inflammatory changes.  She was seen and evaluated      by Pulmonary as well as CVTS, and a PET scan was obtained to determine      whether this nodule posed a concern for possible cancer.  The PET scan      showed no definitive avidity to suggest cancer, but the recommendation      was that the patient have surgery with resection of the nodule and      possible consideration of bronchoscopy.  The patient refused to stop      smoking and therefore was discharged to follow up as an outpatient at      which time she could bring herself to discontinue her tobacco use.  2.  Tobacco abuse.  The patient was seen and counseled.  We provided her      with nicotine gum.  Later in the course of the hospital stay she did      leave the premises to smoke despite our recommendation to discontinue.  3.  Anxiety.  The patient was seen and evaluated by Psychiatry for her      persistent anxiety.  She was started on Neurontin 300 mg t.i.d. and was      recommended to titrate this to 600 today and then 900 t.i.d. The patient      was tapered off of the Xanax.  4.  Pneumonia.  The patient was treated effectively with antibiotic therapy,      namely Avelox.  This was converted over to oral therapy prior to her      discharge.  She was treated with nebulizer treatments and flutter valve.   MEDICATIONS AT DISCHARGE:  Avelox 400 mg once daily, Combivent 2 puffs every  6 hours, Xanax 0.25 every 6 hours as needed, Percocet 1-2 tabs every 6 hours  as needed, Neurontin 300 mg every 8 hours and nicotine patch q.d.   The patient was referred to Dr. Sherene Sires in 1-2 weeks and to CVTS.   She was instructed to stop smoking.   HISTORY OF PRESENT ILLNESS:  The patient is a 40 year old white female with  a known history of a lung mass which was to be followed up as an outpatient  in the past.  She, however, became incarcerated and was unable to do so.  The patient returned and was seen in the emergency room on 04/29/2006 and  complained of  increasing cough and finding of right lung mass.  She  therefore was admitted to the hospital with leukocytosis and possible  exacerbation of asthma.  It was arranged for her to have an interventional  radiology consult for biopsy of lungs.  The patient underwent evaluation by  Radiology.  A needle-guided biopsy was obtained which really only showed  inflammatory changes.  No definitive cancer lesion was identified.  The  patient expressed that if intervention would be needed on this nodule then  it would need to be obtained during this hospital stay as she was unable to  follow up as an outpatient.  Therefore, a consult with CVTS was obtained.  Recommendation was for PET scan as an inpatient and tobacco cessation was  recommended.  Further recommendations were for [facility] to evaluate  the patient.   The patient's hospital course was complicated by vaginal yeast infection  which was treated effectively with Monistat.   The patient continued on IV antibiotics and eventually was converted over to  p.o. Avelox with good result.  She was resumed on nebulizer treatments and  ultimately decision amongst the practitioners was, after seeing her PET  scan, that the avidity was not remarkable but that surgical intervention  should be obtained if the  patient could stop smoking.  The patient therefore  was recommended for smoking cessation and to follow up as an outpatient with  Pulmonary and CT Surgery to consider bronchoscopy and possible resection of  the mass.   PERTINENT LABORATORY VALUES:  As stated, the patient's pathology on needle  biopsy showed inflamed pulmonary parenchyma with no granulomatous  inflammation or evidence of malignancy.  The patient's admitting white count  was 13.4 which decreased at the time of discharge to 8.2 from baseline pH of  7.37 with a PCO2 of 46.6 and a PO2 of 37.8, bicarb of 25.4.   Her discharging liver functions were within normal limits.  Her creatinine  was 0.8.  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  FEV1 was 1.62.   At the time of discharge the patient was deemed stable but relatively  noncompliant with therapy.  She was dispositioned to home to follow up as an  outpatient with Pulmonary and CVTS.      Melissa L. Ladona Ridgel, MD  Electronically Signed     MLT/MEDQ  D:  06/10/2006  T:  06/10/2006  Job:  [id number]   cc:   Charlaine Dalton. Sherene Sires, M.D. [facility]  [address]  [place]  [place] [address]   Sheliah Plane, MD  [address]  [place]  [place] [address]

## 2011-10-23 ENCOUNTER — Other Ambulatory Visit: Payer: Self-pay | Admitting: Nephrology

## 2011-10-23 ENCOUNTER — Ambulatory Visit (HOSPITAL_COMMUNITY)
Admission: RE | Admit: 2011-10-23 | Discharge: 2011-10-23 | Disposition: A | Discharge status: Home / Self Care | Payer: Medicaid Other | Source: Ambulatory Visit | Attending: Nephrology | Admitting: Nephrology

## 2011-10-23 DIAGNOSIS — F17200 Nicotine dependence, unspecified, uncomplicated: Secondary | ICD-10-CM | POA: Insufficient documentation

## 2011-10-23 DIAGNOSIS — R053 Chronic cough: Secondary | ICD-10-CM

## 2011-10-23 DIAGNOSIS — R05 Cough: Secondary | ICD-10-CM | POA: Insufficient documentation

## 2011-10-23 DIAGNOSIS — R059 Cough, unspecified: Secondary | ICD-10-CM | POA: Insufficient documentation

## 2011-10-26 ENCOUNTER — Other Ambulatory Visit: Payer: Self-pay

## 2011-10-26 ENCOUNTER — Emergency Department (HOSPITAL_COMMUNITY)
Admission: EM | Admit: 2011-10-26 | Discharge: 2011-10-26 | Discharge status: Against Medical Advice | Payer: Medicaid Other | Attending: Emergency Medicine | Admitting: Emergency Medicine

## 2011-10-26 ENCOUNTER — Encounter (HOSPITAL_COMMUNITY): Payer: Self-pay | Admitting: Emergency Medicine

## 2011-10-26 DIAGNOSIS — R0602 Shortness of breath: Secondary | ICD-10-CM | POA: Insufficient documentation

## 2011-10-26 DIAGNOSIS — R110 Nausea: Secondary | ICD-10-CM | POA: Insufficient documentation

## 2011-10-26 DIAGNOSIS — R51 Headache: Secondary | ICD-10-CM | POA: Insufficient documentation

## 2011-10-26 DIAGNOSIS — R42 Dizziness and giddiness: Secondary | ICD-10-CM | POA: Insufficient documentation

## 2011-10-26 DIAGNOSIS — I10 Essential (primary) hypertension: Secondary | ICD-10-CM | POA: Insufficient documentation

## 2011-10-26 HISTORY — DX: Pure hypercholesterolemia, unspecified: E78.00

## 2011-10-26 HISTORY — DX: Essential (primary) hypertension: I10

## 2011-10-26 NOTE — ED Notes (Signed)
C/o high bp, headache, nausea, sob, and dizziness since last night.  Reports R sided chest pain that started on the way to the hospital.  Pt states it may be stress and anxiety related.

## 2011-10-26 NOTE — ED Notes (Signed)
Dr. Patrica Duel signed EKG

## 2011-10-26 NOTE — ED Notes (Signed)
Called for patient without answer.  X 1

## 2011-10-26 NOTE — ED Notes (Signed)
Called pt no answer °

## 2011-12-01 ENCOUNTER — Emergency Department (HOSPITAL_COMMUNITY)
Admission: EM | Admit: 2011-12-01 | Discharge: 2011-12-01 | Disposition: A | Discharge status: Home / Self Care | Payer: Medicaid Other | Attending: Emergency Medicine | Admitting: Emergency Medicine

## 2011-12-01 ENCOUNTER — Encounter (HOSPITAL_COMMUNITY): Payer: Self-pay | Admitting: Emergency Medicine

## 2011-12-01 DIAGNOSIS — F17200 Nicotine dependence, unspecified, uncomplicated: Secondary | ICD-10-CM | POA: Insufficient documentation

## 2011-12-01 DIAGNOSIS — IMO0001 Reserved for inherently not codable concepts without codable children: Secondary | ICD-10-CM | POA: Insufficient documentation

## 2011-12-01 DIAGNOSIS — M7989 Other specified soft tissue disorders: Secondary | ICD-10-CM | POA: Insufficient documentation

## 2011-12-01 DIAGNOSIS — L723 Sebaceous cyst: Secondary | ICD-10-CM

## 2011-12-01 DIAGNOSIS — G8929 Other chronic pain: Secondary | ICD-10-CM | POA: Insufficient documentation

## 2011-12-01 DIAGNOSIS — Z79899 Other long term (current) drug therapy: Secondary | ICD-10-CM | POA: Insufficient documentation

## 2011-12-01 DIAGNOSIS — M79609 Pain in unspecified limb: Secondary | ICD-10-CM | POA: Insufficient documentation

## 2011-12-01 DIAGNOSIS — M797 Fibromyalgia: Secondary | ICD-10-CM | POA: Insufficient documentation

## 2011-12-01 DIAGNOSIS — I10 Essential (primary) hypertension: Secondary | ICD-10-CM | POA: Insufficient documentation

## 2011-12-01 DIAGNOSIS — E789 Disorder of lipoprotein metabolism, unspecified: Secondary | ICD-10-CM | POA: Insufficient documentation

## 2011-12-01 HISTORY — DX: Dorsalgia, unspecified: M54.9

## 2011-12-01 HISTORY — DX: Fibromyalgia: M79.7

## 2011-12-01 HISTORY — DX: Other chronic pain: G89.29

## 2011-12-01 MED ORDER — DOXYCYCLINE HYCLATE 100 MG PO CAPS: 100.0000 mg | ORAL_CAPSULE | Freq: Two times a day (BID) | ORAL | Status: DC

## 2011-12-01 MED ORDER — HYDROCODONE-ACETAMINOPHEN 5-325 MG PO TABS
1.0000 | ORAL_TABLET | Freq: Once | ORAL | Status: AC
  Administered 2011-12-01: 1 via ORAL
  Filled 2011-12-01: qty 1

## 2011-12-01 NOTE — ED Notes (Addendum)
Patient currently sitting up in bed; no respiratory or acute distress noted.  Patient updated on plan of care; informed patient that we are currently waiting on discharge paperwork from PA.  Patient has no other questions or concerns at this time.  Will continue to monitor.

## 2011-12-01 NOTE — ED Notes (Signed)
Patient complaining of abscess/cyst on her inner right thigh.  Patient thought that it was an ingrown hair/boil; patient lanced the cyst herself -- states that no pus/blood came out of the cyst and she saw her primary care physician about it.  Patient states that the area became infected, she was treating it with neosporin and gauze.  States that the area burst open on it's own after it had become infected, and states that the area started to get better.  Patient's primary care physician recommended that patient go to an OB/GYN to have abscess looked at.  Patient has been unable to make an appointment, so she came to the ED.  Patient has had cysts in the past, but has never had one lanced.  Able to move all extremities without difficulty.  Patient states that the area is really only painful when touched.  Patient alert and oriented x4; PERRL present.  Will continue to monitor.

## 2011-12-01 NOTE — ED Notes (Signed)
Peter, PA at bedside.

## 2011-12-01 NOTE — ED Notes (Signed)
Pt here with left upper thigh abscess x several weeks; pt sts purulent discharge

## 2011-12-01 NOTE — ED Notes (Signed)
Patient given copy of discharge paperwork; went over discharge instructions with patient. Instructed patient to follow up with primary care physician and dermatologist, to check for signs of infection for the next 48 hours, and to return to the ED for new, worsening, or concerning symptoms.

## 2011-12-01 NOTE — ED Notes (Signed)
Suture cart placed at bedside. 

## 2011-12-01 NOTE — ED Provider Notes (Signed)
History     CSN: [id number]  Arrival date & time 12/01/11  1747   First MD Initiated Contact with Patient 12/01/11 2001      Chief Complaint  Patient presents with  . Abscess     HPI  History provided by the patient. Patient is a 41 year old female with history of hypertension who presents with complaints of a skin nodule to right proximal medial thigh. She reports having a small lump to the area under skin for the past 2-3 weeks. She reports seeing her PCP for this and states that she was referred to OB/GYN. Patient states that she had a history of boils and thought this was similar so she tried to poke the area with a needle at home. After poking the area there was no blood or drainage. The following day the patient reports having increased redness and swelling to the area with pain she began using Bactroban ointment and warm Epson soaks over the area for the past week and a half. Since that time her symptoms have improved dramatically and there is no redness to the skin and the longer but she reports still feeling the same small bump under the skin. She denies any pain at this time. There has been no symptoms of fever, chills, sweats.    Past Medical History  Diagnosis Date  . Abdominal pain   . Nausea and vomiting   . Hypertension   . High cholesterol   . Back pain   . Fibromyalgia   . Chronic back pain     History reviewed. No pertinent past surgical history.  History reviewed. No pertinent family history.  History  Substance Use Topics  . Smoking status: Current Everyday Smoker  . Smokeless tobacco: Not on file  . Alcohol Use: No    OB History    Grav Para Term Preterm Abortions TAB SAB Ect Mult Living                  Review of Systems  Constitutional: Negative for fever and chills.  All other systems reviewed and are negative.    Allergies  Eggs or egg-derived products; Aspirin; and Nsaids  Home Medications   Current Outpatient Rx  Name Route Sig  Dispense Refill  . ALBUTEROL SULFATE HFA 108 (90 BASE) MCG/ACT IN AERS Inhalation Inhale 2 puffs into the lungs every 6 (six) hours as needed. For shortness of breath     . ALPRAZOLAM 0.5 MG PO TABS Oral Take 0.5 mg by mouth 2 (two) times daily.      . ATORVASTATIN CALCIUM 20 MG PO TABS Oral Take 20 mg by mouth at bedtime.     . BUDESONIDE-FORMOTEROL FUMARATE 160-4.5 MCG/ACT IN AERO Inhalation Inhale 2 puffs into the lungs 2 (two) times daily.      Marland Kitchen CARISOPRODOL 350 MG PO TABS Oral Take 350 mg by mouth 3 (three) times daily.      Marland Kitchen DIAZEPAM 2 MG PO TABS Oral Take 4 mg by mouth every 8 (eight) hours as needed. For anxiety    . DICLOFENAC SODIUM 1 % TD GEL Topical Apply 1 application topically 4 (four) times daily as needed. For pain    . ESOMEPRAZOLE MAGNESIUM 40 MG PO CPDR Oral Take 40 mg by mouth 2 (two) times daily.      . FENTANYL 100 MCG/HR TD PT72 Transdermal Place 1 patch onto the skin every 3 (three) days.      Marland Kitchen FLUTICASONE PROPIONATE 50 MCG/ACT NA SUSP  Nasal Place 2 sprays into the nose daily as needed. For nasal congestion    . GABAPENTIN 100 MG PO CAPS Oral Take 100 mg by mouth 3 (three) times daily.      Marland Kitchen MUPIROCIN 2 % EX OINT Topical Apply 1 application topically 3 (three) times daily.      . NEBIVOLOL HCL 5 MG PO TABS Oral Take 5 mg by mouth daily.      . NYSTATIN 100000 UNIT/ML MT SUSP Oral Take 500,000 Units by mouth 4 (four) times daily.      . OXYCODONE-ACETAMINOPHEN 10-325 MG PO TABS Oral Take 1 tablet by mouth every 4 (four) hours as needed. For pain     . PHENAZOPYRIDINE HCL 100 MG PO TABS Oral Take 100 mg by mouth 4 (four) times daily.     Marland Kitchen POLYETHYLENE GLYCOL 3350 PO PACK Oral Take 17 g by mouth daily as needed. For constipation    . PROMETHAZINE HCL 25 MG PO TABS Oral Take 25 mg by mouth every 8 (eight) hours as needed. For nausea    . RANITIDINE HCL 300 MG PO TABS Oral Take 300 mg by mouth at bedtime.      . SUMATRIPTAN 5 MG/ACT NA SOLN Nasal Place 1 spray into the  nose every 2 (two) hours as needed. For migraines    . TRAMADOL HCL 50 MG PO TABS Oral Take 50 mg by mouth every 6 (six) hours as needed. Maximum dose= 8 tablets per day For pain    . TRIAMTERENE-HCTZ 37.5-25 MG PO TABS Oral Take 1 tablet by mouth daily.      Marland Kitchen ZOLPIDEM TARTRATE 10 MG PO TABS Oral Take 10 mg by mouth at bedtime as needed. For sleep     . EPINASTINE HCL 0.05 % OP SOLN Both Eyes Place 1 drop into both eyes 2 (two) times daily.        BP 113/86  Pulse 88  Temp(Src) 98.3 F (36.8 C) (Oral)  Resp 18  Physical Exam  Nursing note and vitals reviewed. Constitutional: She is oriented to person, place, and time. She appears well-developed and well-nourished. No distress.  HENT:  Head: Normocephalic.  Cardiovascular: Normal rate and regular rhythm.   Pulmonary/Chest: Effort normal and breath sounds normal.  Abdominal: Soft.  Neurological: She is alert and oriented to person, place, and time.  Skin: Skin is warm and dry.       1.5 cm firm subcutaneous nodule with slight mobility to the proximal medial right thigh. There is a overlying linear marking with slight erythema consistent with patient history of poking the area with a needle. There is no induration of skin and no erythema.  Psychiatric: She has a normal mood and affect. Her behavior is normal.    ED Course  Procedures     1. Sebaceous cyst       MDM  8:30 PM patient seen and evaluated. Patient in no acute distress.        Angus Seller, [place] 12/01/11 2053

## 2011-12-03 NOTE — ED Provider Notes (Signed)
Medical screening examination/treatment/procedure(s) were performed by non-physician practitioner and as supervising physician I was immediately available for consultation/collaboration.   Shanautica Forker E Jnai Snellgrove, MD 12/03/11 0824 

## 2012-05-09 ENCOUNTER — Other Ambulatory Visit: Payer: Self-pay | Admitting: Nephrology

## 2012-05-10 ENCOUNTER — Other Ambulatory Visit: Payer: Self-pay | Admitting: Nephrology

## 2012-05-12 ENCOUNTER — Other Ambulatory Visit: Payer: Self-pay | Admitting: Nephrology

## 2012-05-20 ENCOUNTER — Other Ambulatory Visit: Payer: Self-pay | Admitting: Nephrology

## 2012-07-15 ENCOUNTER — Ambulatory Visit
Admission: RE | Admit: 2012-07-15 | Discharge: 2012-07-15 | Disposition: A | Discharge status: Home / Self Care | Payer: Medicaid Other | Source: Ambulatory Visit | Attending: Nephrology | Admitting: Nephrology

## 2012-07-15 ENCOUNTER — Other Ambulatory Visit: Payer: Self-pay | Admitting: Nephrology

## 2012-07-15 DIAGNOSIS — Z1231 Encounter for screening mammogram for malignant neoplasm of breast: Secondary | ICD-10-CM

## 2012-07-15 DIAGNOSIS — Z0000 Encounter for general adult medical examination without abnormal findings: Secondary | ICD-10-CM

## 2012-07-22 ENCOUNTER — Ambulatory Visit (INDEPENDENT_AMBULATORY_CARE_PROVIDER_SITE_OTHER): Payer: Medicaid Other | Admitting: General Surgery

## 2012-07-22 ENCOUNTER — Encounter (INDEPENDENT_AMBULATORY_CARE_PROVIDER_SITE_OTHER): Payer: Self-pay | Admitting: General Surgery

## 2012-07-22 VITALS — BP 142/84 | HR 72 | Temp 97.4°F | Resp 16 | Ht 62.0 in | Wt 221.0 lb

## 2012-07-22 DIAGNOSIS — D179 Benign lipomatous neoplasm, unspecified: Secondary | ICD-10-CM

## 2012-07-22 NOTE — Progress Notes (Signed)
Subjective:     Patient ID: [name] [name], female   DOB: Feb 27, 1971, 41 y.o.   MRN: [id number]  HPI 41 y/o Female with about 1-2 mo h/o of L shoulder nodule.  Pt with no other sx's except pain on palpation.  Has increased in size over last month or so.  Pt states she had tried bactroban with no results.  She is mainly concerned because of the FHx of cancer.  Review of Systems  Constitutional: Negative.   HENT: Negative.   Eyes: Negative.   Respiratory: Negative.   Cardiovascular: Negative.   Gastrointestinal: Negative.   Skin: Negative.        Objective:   Physical Exam  Constitutional: She appears well-developed and well-nourished.  HENT:  Head: Normocephalic and atraumatic.  Eyes: Pupils are equal, round, and reactive to light.  Neck: Normal range of motion. Neck supple.  Cardiovascular: Normal rate, regular rhythm and normal heart sounds.   Pulmonary/Chest: Effort normal and breath sounds normal.  Abdominal: Soft. Bowel sounds are normal.  Musculoskeletal: Normal range of motion.  Skin:          ~1cm subcutaneous nodule, freely mobile, no erythema or drainage.       Assessment:     L shoulder lipoma vs. Sebaceous cyst.    Plan:     -f/u PRN -if continues to increase in size, will plan on removing in the OR secondary to its depth and poor delineation.

## 2012-08-05 ENCOUNTER — Ambulatory Visit: Payer: Medicaid Other

## 2012-08-12 ENCOUNTER — Other Ambulatory Visit: Payer: Self-pay | Admitting: Nephrology

## 2012-08-26 ENCOUNTER — Ambulatory Visit
Admission: RE | Admit: 2012-08-26 | Discharge: 2012-08-26 | Disposition: A | Discharge status: Home / Self Care | Payer: Medicaid Other | Source: Ambulatory Visit | Attending: Nephrology | Admitting: Nephrology

## 2012-08-26 ENCOUNTER — Other Ambulatory Visit: Payer: Self-pay | Admitting: Nephrology

## 2012-08-26 DIAGNOSIS — R059 Cough, unspecified: Secondary | ICD-10-CM

## 2012-08-26 DIAGNOSIS — R05 Cough: Secondary | ICD-10-CM

## 2013-02-02 ENCOUNTER — Encounter (HOSPITAL_COMMUNITY): Payer: Self-pay | Admitting: *Deleted

## 2013-02-02 ENCOUNTER — Emergency Department (HOSPITAL_COMMUNITY)
Admission: EM | Admit: 2013-02-02 | Discharge: 2013-02-02 | Disposition: A | Discharge status: Home / Self Care | Payer: Medicaid Other | Attending: Emergency Medicine | Admitting: Emergency Medicine

## 2013-02-02 DIAGNOSIS — F19239 Other psychoactive substance dependence with withdrawal, unspecified: Secondary | ICD-10-CM | POA: Insufficient documentation

## 2013-02-02 DIAGNOSIS — J45909 Unspecified asthma, uncomplicated: Secondary | ICD-10-CM | POA: Insufficient documentation

## 2013-02-02 DIAGNOSIS — Z760 Encounter for issue of repeat prescription: Secondary | ICD-10-CM

## 2013-02-02 DIAGNOSIS — R197 Diarrhea, unspecified: Secondary | ICD-10-CM | POA: Insufficient documentation

## 2013-02-02 DIAGNOSIS — R109 Unspecified abdominal pain: Secondary | ICD-10-CM | POA: Insufficient documentation

## 2013-02-02 DIAGNOSIS — Z8739 Personal history of other diseases of the musculoskeletal system and connective tissue: Secondary | ICD-10-CM | POA: Insufficient documentation

## 2013-02-02 DIAGNOSIS — E7800 Pure hypercholesterolemia, unspecified: Secondary | ICD-10-CM | POA: Insufficient documentation

## 2013-02-02 DIAGNOSIS — Z79899 Other long term (current) drug therapy: Secondary | ICD-10-CM | POA: Insufficient documentation

## 2013-02-02 DIAGNOSIS — R5381 Other malaise: Secondary | ICD-10-CM | POA: Insufficient documentation

## 2013-02-02 DIAGNOSIS — F19939 Other psychoactive substance use, unspecified with withdrawal, unspecified: Secondary | ICD-10-CM | POA: Insufficient documentation

## 2013-02-02 DIAGNOSIS — I10 Essential (primary) hypertension: Secondary | ICD-10-CM | POA: Insufficient documentation

## 2013-02-02 DIAGNOSIS — F1123 Opioid dependence with withdrawal: Secondary | ICD-10-CM

## 2013-02-02 DIAGNOSIS — F17200 Nicotine dependence, unspecified, uncomplicated: Secondary | ICD-10-CM | POA: Insufficient documentation

## 2013-02-02 DIAGNOSIS — R5383 Other fatigue: Secondary | ICD-10-CM | POA: Insufficient documentation

## 2013-02-02 MED ORDER — OXYCODONE HCL 30 MG PO TABS: 30.0000 mg | ORAL_TABLET | ORAL | Status: DC | PRN

## 2013-02-02 MED ORDER — METHYLPHENIDATE HCL 10 MG PO TABS: 10.0000 mg | ORAL_TABLET | Freq: Two times a day (BID) | ORAL | Status: DC

## 2013-02-02 NOTE — ED Provider Notes (Signed)
History     CSN: [id number]  Arrival date & time 02/02/13  1150   First MD Initiated Contact with Patient 02/02/13 1319      Chief Complaint  Patient presents with  . Nausea  . Emesis  . multiple complaints     (Consider location/radiation/quality/duration/timing/severity/associated sxs/prior treatment) Patient is a 42 y.o. female presenting with vomiting. The history is provided by the patient.  Emesis Associated symptoms: abdominal pain and diarrhea   Associated symptoms: no headaches    patient's question refill of her chronic pain medications. Her primary care Dr. has lost his license that she can no longer get a high-dose of narcotics that she's been on. She states she's had nausea vomiting some diarrhea. She states she's been spreading up the medications. She states the doctor's office told her to get medications from the ER until she can get in to the pain clinic. She states the pain clinic has not been taking her. No new trauma. She has chronic multiple complaints.  Past Medical History  Diagnosis Date  . Abdominal pain   . Nausea and vomiting   . Hypertension   . High cholesterol   . Back pain   . Fibromyalgia   . Chronic back pain   . Asthma   . Blood transfusion 1989    History reviewed. No pertinent past surgical history.  Family History  Problem Relation Age of Onset  . Cancer Mother     brain, breast, lung, colon  . Diabetes Maternal Grandmother   . Heart disease Maternal Grandmother   . Diabetes Maternal Grandfather   . Heart disease Maternal Grandfather   . Diabetes Paternal Grandmother   . Heart disease Paternal Grandmother   . Stroke Paternal Grandmother   . Diabetes Paternal Grandfather   . Heart disease Paternal Grandfather   . Stroke Paternal Grandfather     History  Substance Use Topics  . Smoking status: Current Every Day Smoker  . Smokeless tobacco: Not on file  . Alcohol Use: No    OB History   Grav Para Term Preterm Abortions TAB  SAB Ect Mult Living                  Review of Systems  Constitutional: Positive for fatigue.  Respiratory: Negative for cough.   Gastrointestinal: Positive for nausea, vomiting, abdominal pain and diarrhea.  Genitourinary: Negative for flank pain.  Musculoskeletal: Positive for back pain.  Skin: Negative for rash.  Neurological: Negative for headaches.    Allergies  Eggs or egg-derived products; Nucynta; Aspirin; and Nsaids  Home Medications   Current Outpatient Rx  Name  Route  Sig  Dispense  Refill  . albuterol (PROVENTIL HFA;VENTOLIN HFA) 108 (90 BASE) MCG/ACT inhaler   Inhalation   Inhale 2 puffs into the lungs every 6 (six) hours as needed. For shortness of breath          . ALPRAZolam (XANAX) 1 MG tablet   Oral   Take 1 mg by mouth 2 (two) times daily.         . budesonide-formoterol (SYMBICORT) 160-4.5 MCG/ACT inhaler   Inhalation   Inhale 2 puffs into the lungs 2 (two) times daily as needed (for wheezing).          . diazepam (VALIUM) 2 MG tablet   Oral   Take 4 mg by mouth every 8 (eight) hours as needed. For anxiety         . diclofenac sodium (VOLTAREN) 1 %  GEL   Topical   Apply 1 application topically 4 (four) times daily as needed. For pain         . esomeprazole (NEXIUM) 40 MG capsule   Oral   Take 40 mg by mouth 2 (two) times daily.           . fentaNYL (DURAGESIC - DOSED MCG/HR) 100 MCG/HR   Transdermal   Place 1 patch onto the skin every 3 (three) days.           . fluticasone (FLONASE) 50 MCG/ACT nasal spray   Nasal   Place 2 sprays into the nose daily as needed. For nasal congestion         . gabapentin (NEURONTIN) 100 MG capsule   Oral   Take 100 mg by mouth 3 (three) times daily.           . methylphenidate (RITALIN) 10 MG tablet   Oral   Take 10 mg by mouth 2 (two) times daily.         Marland Kitchen morphine (MS CONTIN) 60 MG 12 hr tablet   Oral   Take 30-60 mg by mouth 3 (three) times daily.          . mupirocin  ointment (BACTROBAN) 2 %   Topical   Apply 1 application topically 3 (three) times daily.           Marland Kitchen nystatin (MYCOSTATIN) 100000 UNIT/ML suspension   Oral   Take 500,000 Units by mouth 4 (four) times daily.           Marland Kitchen oxycodone (ROXICODONE) 30 MG immediate release tablet   Oral   Take 60 mg by mouth every 4 (four) hours as needed for pain.         Marland Kitchen oxyCODONE-acetaminophen (PERCOCET) 10-325 MG per tablet   Oral   Take 1 tablet by mouth every 4 (four) hours as needed. For pain          . phenazopyridine (PYRIDIUM) 100 MG tablet   Oral   Take 100 mg by mouth 4 (four) times daily.          . polyethylene glycol (MIRALAX / GLYCOLAX) packet   Oral   Take 17 g by mouth daily as needed. For constipation         . promethazine (PHENERGAN) 25 MG tablet   Oral   Take 25 mg by mouth every 8 (eight) hours as needed. For nausea         . ranitidine (ZANTAC) 300 MG tablet   Oral   Take 300 mg by mouth every morning.          . SUMAtriptan (IMITREX) 5 MG/ACT nasal spray   Nasal   Place 1 spray into the nose every 2 (two) hours as needed. For migraines         . tiZANidine (ZANAFLEX) 2 MG tablet   Oral   Take 2 mg by mouth 3 (three) times daily as needed (pain).         . traMADol (ULTRAM) 50 MG tablet   Oral   Take 50 mg by mouth every 6 (six) hours as needed. Maximum dose= 8 tablets per day For pain         . triamterene-hydrochlorothiazide (MAXZIDE-25) 37.5-25 MG per tablet   Oral   Take 1 tablet by mouth daily.           Marland Kitchen zolpidem (AMBIEN) 10 MG tablet   Oral   Take  10 mg by mouth at bedtime as needed. For sleep          . methylphenidate (RITALIN) 10 MG tablet   Oral   Take 1 tablet (10 mg total) by mouth 2 (two) times daily.   20 tablet   0   . oxycodone (ROXICODONE) 30 MG immediate release tablet   Oral   Take 1 tablet (30 mg total) by mouth every 4 (four) hours as needed for pain.   20 tablet   0     BP 142/81  Pulse 94   Temp(Src) 98.5 F (36.9 C) (Oral)  Resp 18  SpO2 95%  Physical Exam  Nursing note and vitals reviewed. Constitutional: She is oriented to person, place, and time. She appears well-developed and well-nourished.  HENT:  Head: Normocephalic and atraumatic.  Eyes: EOM are normal. Pupils are equal, round, and reactive to light.  Neck: Normal range of motion. Neck supple.  Cardiovascular: Normal rate, regular rhythm and normal heart sounds.   No murmur heard. Pulmonary/Chest: Effort normal and breath sounds normal. No respiratory distress. She has no wheezes. She has no rales.  Abdominal: Soft. Bowel sounds are normal. She exhibits no distension. There is no tenderness. There is no rebound and no guarding.  Musculoskeletal: Normal range of motion.  Neurological: She is alert and oriented to person, place, and time. No cranial nerve deficit.  Skin: Skin is warm and dry.  Psychiatric: She has a normal mood and affect. Her speech is normal.    ED Course  Procedures (including critical care time)  Labs Reviewed - No data to display No results found.   1. Medication refill   2. Opioid withdrawal       MDM  Patient's questions refills of her chronic medications. Her primary care doctor's office his license. Controlled substance report in database was reviewed in she's only been getting medications from her Dr.   Doreatha Lew be given a short course of refills of some of the medications. She states she cannot afford all of them. She'll find a primary care doctor or the pain clinic to follow with.       Juliet Rude. Rubin Payor, MD 02/02/13 2208

## 2013-02-02 NOTE — ED Notes (Signed)
States she is w/ drawing from the pain meds she has been rx by dr frazier. He cannot write rx anymore and she needs some meds to hold her over till she can get into pain clinic

## 2013-02-02 NOTE — ED Notes (Addendum)
Pt reports that her PCP (dr frasier) lost his medical license and has been unable to get her medications (morphine, fentanyl patch, percocet) filled.  States that she has been nauseated and vomited multiple times.  No shaking, sweating noted.  States that she is trying to get into the pain clinic.  Pt unable to afford medications and does not have a rx for the medications.

## 2013-02-08 ENCOUNTER — Emergency Department (HOSPITAL_COMMUNITY): Payer: Medicaid Other

## 2013-02-08 ENCOUNTER — Encounter (HOSPITAL_COMMUNITY): Payer: Self-pay | Admitting: *Deleted

## 2013-02-08 ENCOUNTER — Emergency Department (HOSPITAL_COMMUNITY)
Admission: EM | Admit: 2013-02-08 | Discharge: 2013-02-08 | Disposition: A | Discharge status: Home / Self Care | Payer: Medicaid Other | Attending: Emergency Medicine | Admitting: Emergency Medicine

## 2013-02-08 DIAGNOSIS — R079 Chest pain, unspecified: Secondary | ICD-10-CM | POA: Insufficient documentation

## 2013-02-08 DIAGNOSIS — Z8719 Personal history of other diseases of the digestive system: Secondary | ICD-10-CM | POA: Insufficient documentation

## 2013-02-08 DIAGNOSIS — Z79899 Other long term (current) drug therapy: Secondary | ICD-10-CM | POA: Insufficient documentation

## 2013-02-08 DIAGNOSIS — J45909 Unspecified asthma, uncomplicated: Secondary | ICD-10-CM | POA: Insufficient documentation

## 2013-02-08 DIAGNOSIS — Z8739 Personal history of other diseases of the musculoskeletal system and connective tissue: Secondary | ICD-10-CM | POA: Insufficient documentation

## 2013-02-08 DIAGNOSIS — G8929 Other chronic pain: Secondary | ICD-10-CM | POA: Insufficient documentation

## 2013-02-08 DIAGNOSIS — I10 Essential (primary) hypertension: Secondary | ICD-10-CM | POA: Insufficient documentation

## 2013-02-08 DIAGNOSIS — E7800 Pure hypercholesterolemia, unspecified: Secondary | ICD-10-CM | POA: Insufficient documentation

## 2013-02-08 DIAGNOSIS — F17200 Nicotine dependence, unspecified, uncomplicated: Secondary | ICD-10-CM | POA: Insufficient documentation

## 2013-02-08 LAB — CBC WITH DIFFERENTIAL/PLATELET
Basophils Absolute: 0.1 10*3/uL (ref 0.0–0.1)
Basophils Relative: 1 % (ref 0–1)
Eosinophils Absolute: 0.2 10*3/uL (ref 0.0–0.7)
Eosinophils Relative: 3 % (ref 0–5)
HCT: 43.5 % (ref 36.0–46.0)
Hemoglobin: 15 g/dL (ref 12.0–15.0)
Lymphocytes Relative: 50 % — ABNORMAL HIGH (ref 12–46)
Lymphs Abs: 3.5 10*3/uL (ref 0.7–4.0)
MCH: 29.6 pg (ref 26.0–34.0)
MCHC: 34.5 g/dL (ref 30.0–36.0)
MCV: 85.8 fL (ref 78.0–100.0)
Monocytes Absolute: 0.7 10*3/uL (ref 0.1–1.0)
Monocytes Relative: 9 % (ref 3–12)
Neutro Abs: 2.5 10*3/uL (ref 1.7–7.7)
Neutrophils Relative %: 36 % — ABNORMAL LOW (ref 43–77)
Platelets: 225 10*3/uL (ref 150–400)
RBC: 5.07 MIL/uL (ref 3.87–5.11)
RDW: 13.7 % (ref 11.5–15.5)
WBC: 7 10*3/uL (ref 4.0–10.5)

## 2013-02-08 LAB — BASIC METABOLIC PANEL
BUN: 7 mg/dL (ref 6–23)
CO2: 30 mEq/L (ref 19–32)
Calcium: 8.9 mg/dL (ref 8.4–10.5)
Chloride: 102 mEq/L (ref 96–112)
Creatinine, Ser: 0.83 mg/dL (ref 0.50–1.10)
GFR calc Af Amer: >90 mL/min (ref >90)
GFR calc non Af Amer: 85 mL/min — ABNORMAL LOW (ref >90)
Glucose, Bld: 121 mg/dL — ABNORMAL HIGH (ref 70–99)
Potassium: 4 mEq/L (ref 3.5–5.1)
Sodium: 141 mEq/L (ref 135–145)

## 2013-02-08 LAB — POCT I-STAT TROPONIN I: Troponin i, poc: 0.01 ng/mL (ref 0.00–0.08)

## 2013-02-08 NOTE — ED Notes (Signed)
Pt agitated. states that she is just ready to leave.

## 2013-02-08 NOTE — ED Provider Notes (Signed)
History     CSN: [id number]  Arrival date & time 02/08/13  1523   First MD Initiated Contact with Patient 02/08/13 1613      Chief Complaint  Patient presents with  . Medication Refill    (Consider location/radiation/quality/duration/timing/severity/associated sxs/prior treatment) HPI Comments: Pt presents to the ED for medication refills.  Was seen here last week and was given short course of her pain medications because her PCP has lost his license to prescribe them.  Was supposed to be set up with pain management but states that her paperwork has not gone through yet.  Also admits to some chest pain over the past month.  Pain is constant but varies with intensity, is localized over the right side of her chest.  Pain is exacerbated by coughing and deep breathing. Notes some recent abdominal pain and diarrhea because she is out of her medication.  Denies any fever, chills, sweats, or sinus congestion.  Pt is a daily smoker.  The history is provided by the patient.    Past Medical History  Diagnosis Date  . Abdominal pain   . Nausea and vomiting   . Hypertension   . High cholesterol   . Back pain   . Fibromyalgia   . Chronic back pain   . Asthma   . Blood transfusion 1989    History reviewed. No pertinent past surgical history.  Family History  Problem Relation Age of Onset  . Cancer Mother     brain, breast, lung, colon  . Diabetes Maternal Grandmother   . Heart disease Maternal Grandmother   . Diabetes Maternal Grandfather   . Heart disease Maternal Grandfather   . Diabetes Paternal Grandmother   . Heart disease Paternal Grandmother   . Stroke Paternal Grandmother   . Diabetes Paternal Grandfather   . Heart disease Paternal Grandfather   . Stroke Paternal Grandfather     History  Substance Use Topics  . Smoking status: Current Every Day Smoker  . Smokeless tobacco: Not on file  . Alcohol Use: No    OB History   Grav Para Term Preterm Abortions TAB SAB Ect  Mult Living                  Review of Systems  Cardiovascular: Positive for chest pain.  All other systems reviewed and are negative.    Allergies  Bee venom; Eggs or egg-derived products; Nucynta; Mushroom extract complex; Aspirin; and Nsaids  Home Medications   Current Outpatient Rx  Name  Route  Sig  Dispense  Refill  . albuterol (PROVENTIL HFA;VENTOLIN HFA) 108 (90 BASE) MCG/ACT inhaler   Inhalation   Inhale 2 puffs into the lungs every 6 (six) hours as needed. For shortness of breath          . ALPRAZolam (XANAX) 1 MG tablet   Oral   Take 1 mg by mouth 2 (two) times daily.         . beclomethasone (QVAR) 80 MCG/ACT inhaler   Inhalation   Inhale 1 puff into the lungs 2 (two) times daily.         . budesonide-formoterol (SYMBICORT) 160-4.5 MCG/ACT inhaler   Inhalation   Inhale 2 puffs into the lungs 2 (two) times daily as needed (for wheezing).          . diazepam (VALIUM) 2 MG tablet   Oral   Take 4 mg by mouth every 8 (eight) hours as needed. For anxiety         .  diclofenac sodium (VOLTAREN) 1 % GEL   Topical   Apply 1 application topically 4 (four) times daily as needed. For pain         . esomeprazole (NEXIUM) 40 MG capsule   Oral   Take 40 mg by mouth 2 (two) times daily.           . fluticasone (FLONASE) 50 MCG/ACT nasal spray   Nasal   Place 2 sprays into the nose daily as needed. For nasal congestion         . gabapentin (NEURONTIN) 100 MG capsule   Oral   Take 100 mg by mouth 3 (three) times daily.           Marland Kitchen lidocaine (XYLOCAINE) 2 % solution   Oral   Take 5 mLs by mouth as needed (for mouth pain).         . methylphenidate (RITALIN) 10 MG tablet   Oral   Take 10 mg by mouth 2 (two) times daily.         Marland Kitchen morphine (MS CONTIN) 60 MG 12 hr tablet   Oral   Take 60 mg by mouth 3 (three) times daily.          . mupirocin ointment (BACTROBAN) 2 %   Topical   Apply 1 application topically 3 (three) times daily.            Marland Kitchen nystatin (MYCOSTATIN) 100000 UNIT/ML suspension   Oral   Take 500,000 Units by mouth 4 (four) times daily.           Marland Kitchen oxycodone (ROXICODONE) 30 MG immediate release tablet   Oral   Take 60 mg by mouth every 4 (four) hours as needed for pain.         Marland Kitchen oxyCODONE-acetaminophen (PERCOCET) 10-325 MG per tablet   Oral   Take 1 tablet by mouth every 4 (four) hours as needed. For pain          . phenazopyridine (PYRIDIUM) 100 MG tablet   Oral   Take 100 mg by mouth 4 (four) times daily.          . promethazine (PHENERGAN) 25 MG tablet   Oral   Take 25 mg by mouth every 8 (eight) hours as needed. For nausea         . ranitidine (ZANTAC) 300 MG tablet   Oral   Take 300 mg by mouth every morning.          . SUMAtriptan (IMITREX) 5 MG/ACT nasal spray   Nasal   Place 1 spray into the nose every 2 (two) hours as needed. For migraines         . tiZANidine (ZANAFLEX) 2 MG tablet   Oral   Take 2 mg by mouth 3 (three) times daily as needed (pain).         . traMADol (ULTRAM) 50 MG tablet   Oral   Take 50 mg by mouth every 6 (six) hours as needed. Maximum dose= 8 tablets per day For pain         . triamterene-hydrochlorothiazide (MAXZIDE-25) 37.5-25 MG per tablet   Oral   Take 1 tablet by mouth daily.             BP 141/94  Pulse 95  Temp(Src) 98.1 F (36.7 C) (Oral)  Resp 18  SpO2 97%  LMP 01/28/2013  Physical Exam  Nursing note and vitals reviewed. Constitutional: She is oriented to person, place, and time.  She appears well-developed and well-nourished.  HENT:  Head: Normocephalic and atraumatic.  Right Ear: Tympanic membrane and ear canal normal.  Left Ear: Tympanic membrane and ear canal normal.  Nose: Nose normal.  Mouth/Throat: Uvula is midline and mucous membranes are normal. Posterior oropharyngeal edema present. No posterior oropharyngeal erythema.  Eyes: Conjunctivae and EOM are normal. Pupils are equal, round, and reactive to light.   Neck: Normal range of motion.  Cardiovascular: Normal rate, regular rhythm and normal heart sounds.   Pulmonary/Chest: Effort normal. She has wheezes in the right lower field. She has rhonchi.  Abdominal: Soft. Bowel sounds are normal. There is no tenderness. There is no CVA tenderness.  Generalized abdominal discomfort but no true TTP  Neurological: She is alert and oriented to person, place, and time.  Skin: Skin is warm and dry.  Psychiatric: She has a normal mood and affect.    ED Course  Procedures (including critical care time)   Date: 02/08/2013  Rate: 80  Rhythm: normal sinus rhythm  QRS Axis: normal  Intervals: normal  ST/T Wave abnormalities: normal  Conduction Disutrbances:none  Narrative Interpretation:   Old EKG Reviewed: unchanged    Labs Reviewed  CBC WITH DIFFERENTIAL - Abnormal; Notable for the following:    Neutrophils Relative 36 (*)    Lymphocytes Relative 50 (*)    All other components within normal limits  BASIC METABOLIC PANEL - Abnormal; Notable for the following:    Glucose, Bld 121 (*)    GFR calc non Af Amer 85 (*)    All other components within normal limits  POCT I-STAT TROPONIN I   Dg Chest 2 View  02/08/2013  *RADIOLOGY REPORT*  Clinical Data: Cough for 3 days.  Cold symptoms of 3 weeks, history of smoking and hypertension  CHEST - 2 VIEW  Comparison: 08/26/2012  Findings: Heart size and vascular pattern are normal.  Blunting of the right costophrenic angle is stable.  Old superior right rib fracture and acromioclavicular joint separation stable.  No infiltrate or consolidation.  IMPRESSION: No acute findings.   Original Report Authenticated By: Esperanza Heir, M.D.      1. Back pain, chronic   2. Chest pain       MDM   42 y.o. Female presenting to the ED for med refills.  Seen last week for the same complaint.  States she has not been able to get into pain management yet.  Now is complaining of also admits to some chest pain over the  past month.  Pain is constant but varies with intensity, is localized over the right side of her chest.  Cardiac work-up negative.  Pt will be discharged without rx for medications.  Explained to her that pain management will be responsible for narcotic prescriptions.  Pt is unhappy with this but acknowledges that she understands.  Resource list given as pt wants to know about other PCP offices and pain management clinics in the area.  Return precautions advised.     Garlon Hatchet, PA-C 02/09/13 [phone]

## 2013-02-08 NOTE — ED Notes (Signed)
Pt is here for med refill and cough.  Pt was tx here on 3/12 and dx with med refill opioid withdrawal.  She was given a prescription for ritalin and oxycodone for chronic back pain until she was able to have it taken care of at the pain clinic.  However, the people at the pain clinic told here to come here.  Pt states she also has a hx of chronic bronchitis and cough and is now beginning to experience R chest pain post cough.

## 2013-02-09 NOTE — ED Provider Notes (Signed)
Medical screening examination/treatment/procedure(s) were performed by non-physician practitioner and as supervising physician I was immediately available for consultation/collaboration.  Juliet Rude. Rubin Payor, MD 02/09/13 [phone]

## 2013-04-01 ENCOUNTER — Ambulatory Visit
Admission: RE | Admit: 2013-04-01 | Discharge: 2013-04-01 | Disposition: A | Discharge status: Home / Self Care | Payer: Medicaid Other | Source: Ambulatory Visit | Attending: Cardiovascular Disease | Admitting: Cardiovascular Disease

## 2013-04-01 ENCOUNTER — Other Ambulatory Visit: Payer: Self-pay | Admitting: Cardiovascular Disease

## 2013-04-01 DIAGNOSIS — M549 Dorsalgia, unspecified: Secondary | ICD-10-CM

## 2013-04-29 ENCOUNTER — Ambulatory Visit: Payer: Self-pay | Admitting: Internal Medicine

## 2013-06-09 ENCOUNTER — Telehealth: Payer: Self-pay | Admitting: Internal Medicine

## 2013-06-09 NOTE — Telephone Encounter (Signed)
Pt called says she cancelled the appt 3 days prior to appointment date.  Approved to cancel no show charge.  [facility]

## 2013-12-08 ENCOUNTER — Encounter: Payer: Self-pay | Admitting: Neurology

## 2013-12-09 ENCOUNTER — Ambulatory Visit (INDEPENDENT_AMBULATORY_CARE_PROVIDER_SITE_OTHER): Payer: Medicaid Other | Admitting: Neurology

## 2013-12-09 ENCOUNTER — Encounter (INDEPENDENT_AMBULATORY_CARE_PROVIDER_SITE_OTHER): Payer: Self-pay

## 2013-12-09 ENCOUNTER — Encounter: Payer: Self-pay | Admitting: Neurology

## 2013-12-09 VITALS — BP 144/89 | HR 82 | Ht 63.5 in | Wt 230.0 lb

## 2013-12-09 DIAGNOSIS — M549 Dorsalgia, unspecified: Secondary | ICD-10-CM

## 2013-12-09 DIAGNOSIS — IMO0001 Reserved for inherently not codable concepts without codable children: Secondary | ICD-10-CM

## 2013-12-09 DIAGNOSIS — G8929 Other chronic pain: Secondary | ICD-10-CM

## 2013-12-09 DIAGNOSIS — M797 Fibromyalgia: Secondary | ICD-10-CM

## 2013-12-09 DIAGNOSIS — G43009 Migraine without aura, not intractable, without status migrainosus: Secondary | ICD-10-CM

## 2013-12-09 HISTORY — DX: Migraine without aura, not intractable, without status migrainosus: G43.009

## 2013-12-09 MED ORDER — PREGABALIN 50 MG PO CAPS: ORAL_CAPSULE | ORAL | Status: DC

## 2013-12-09 MED ORDER — SUMATRIPTAN 5 MG/ACT NA SOLN: 1.0000 | Freq: Two times a day (BID) | NASAL | Status: DC | PRN

## 2013-12-09 NOTE — Patient Instructions (Signed)
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
## 2013-12-14 ENCOUNTER — Encounter: Payer: Medicaid Other | Admitting: Radiology

## 2013-12-14 ENCOUNTER — Encounter: Payer: Medicaid Other | Admitting: Neurology

## 2013-12-20 ENCOUNTER — Encounter (INDEPENDENT_AMBULATORY_CARE_PROVIDER_SITE_OTHER): Payer: Medicaid Other | Admitting: Radiology

## 2013-12-20 ENCOUNTER — Ambulatory Visit (INDEPENDENT_AMBULATORY_CARE_PROVIDER_SITE_OTHER): Payer: Medicaid Other | Admitting: Neurology

## 2013-12-20 ENCOUNTER — Telehealth: Payer: Self-pay | Admitting: Neurology

## 2013-12-20 DIAGNOSIS — M549 Dorsalgia, unspecified: Secondary | ICD-10-CM

## 2013-12-20 DIAGNOSIS — G8929 Other chronic pain: Secondary | ICD-10-CM

## 2013-12-20 DIAGNOSIS — IMO0001 Reserved for inherently not codable concepts without codable children: Secondary | ICD-10-CM

## 2013-12-20 DIAGNOSIS — M79609 Pain in unspecified limb: Secondary | ICD-10-CM

## 2013-12-20 DIAGNOSIS — Z0289 Encounter for other administrative examinations: Secondary | ICD-10-CM

## 2013-12-20 DIAGNOSIS — M797 Fibromyalgia: Secondary | ICD-10-CM

## 2013-12-20 DIAGNOSIS — G43009 Migraine without aura, not intractable, without status migrainosus: Secondary | ICD-10-CM

## 2013-12-20 NOTE — Progress Notes (Signed)
[name] [name] is a 43 year old patient with a history of fibromyalgia, obesity, chronic low back pain. The patient is evaluated for the pain in the back and down the legs.  Nerve conduction study shows mild dysfunction of the left peroneal nerve. EMG evaluation, however, on the left lower extremity was normal, without evidence of a lumbosacral radiculopathy or evidence of denervation within the peroneal nerve distribution.  The patient is felt to have fibromyalgia. The patient will be placed on Lyrica, and she will followup in 3-4 months.

## 2013-12-20 NOTE — Telephone Encounter (Signed)
All info was already sent to Medicaid.  They have updated their formulary, and Lyrica is not a preferred drug.  I called them to follow up.  They asked that we send more clinical info for the medical review board to analyze.  I have done so.  They indicate a determination should be made within 24-48 hours.  I called the patient at home, got no answer.  Unable to LM.  Called cell, got no answer.  Left message.

## 2013-12-20 NOTE — Procedures (Signed)
     HISTORY:  [name] [name] is a 43 year old patient with a history of obesity, fibromyalgia, and chronic low back pain. The patient is being evaluated for the pain issues. The patient reports discomfort down the legs as well. The pain has recently increased in severity.  NERVE CONDUCTION STUDIES:  Nerve conduction studies were performed on both lower extremities. The distal motor latencies and motor amplitudes for the peroneal and posterior tibial nerves were normal bilaterally. The nerve conduction velocities for these nerves were normal bilaterally. The F wave latencies for the peroneal nerves were unobtainable on the left, normal on the right, and normal for the posterior tibial nerves bilaterally. The nerve conduction velocities for the peroneal and posterior tibial nerves were normal bilaterally. The H reflex latencies were minimally prolonged but symmetric bilaterally. The peroneal sensory latencies were normal on the right, absent on the left.  EMG STUDIES:  EMG study was performed on the left lower extremity:  The tibialis anterior muscle reveals 2 to 4K motor units with full recruitment. No fibrillations or positive waves were seen. The peroneus tertius muscle reveals 2 to 4K motor units with full recruitment. No fibrillations or positive waves were seen. The medial gastrocnemius muscle reveals 1 to 3K motor units with full recruitment. No fibrillations or positive waves were seen. The vastus lateralis muscle reveals 2 to 4K motor units with full recruitment. No fibrillations or positive waves were seen. The iliopsoas muscle reveals 2 to 4K motor units with full recruitment. No fibrillations or positive waves were seen. The biceps femoris muscle (long head) reveals 2 to 4K motor units with full recruitment. No fibrillations or positive waves were seen. The lumbosacral paraspinal muscles were tested at 3 levels, and revealed no abnormalities of insertional activity at all 3 levels  tested. There was good relaxation.   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 EMG evaluation of the left lower extremity was unremarkable, without evidence of an overlying lumbosacral radiculopathy. No evidence of denervation within the distribution of the left peroneal nerve distribution is noted.  Jill Alexanders MD 12/20/2013 4:35 PM  [facility] [address] [place], [place] [address]  Phone [phone] Fax [phone]

## 2013-12-20 NOTE — Telephone Encounter (Signed)
Patient needs Lyrica refill, needs prior authorization. Patient has not been taking it for 2 wks now and they have sent over another prior authorization request.

## 2013-12-21 ENCOUNTER — Encounter: Payer: Medicaid Other | Admitting: Neurology

## 2014-07-07 ENCOUNTER — Ambulatory Visit
Admission: RE | Admit: 2014-07-07 | Discharge: 2014-07-07 | Disposition: A | Discharge status: Home / Self Care | Payer: Medicaid Other | Source: Ambulatory Visit | Attending: Cardiovascular Disease | Admitting: Cardiovascular Disease

## 2014-07-07 ENCOUNTER — Other Ambulatory Visit: Payer: Self-pay | Admitting: Cardiovascular Disease

## 2014-07-07 DIAGNOSIS — R109 Unspecified abdominal pain: Secondary | ICD-10-CM

## 2014-08-23 ENCOUNTER — Other Ambulatory Visit (HOSPITAL_COMMUNITY): Payer: Self-pay | Admitting: Cardiovascular Disease

## 2014-08-23 DIAGNOSIS — Z1231 Encounter for screening mammogram for malignant neoplasm of breast: Secondary | ICD-10-CM

## 2014-09-29 ENCOUNTER — Ambulatory Visit (INDEPENDENT_AMBULATORY_CARE_PROVIDER_SITE_OTHER): Payer: Medicaid Other | Admitting: Family Medicine

## 2014-09-29 ENCOUNTER — Encounter: Payer: Self-pay | Admitting: Family Medicine

## 2014-09-29 ENCOUNTER — Ambulatory Visit (HOSPITAL_COMMUNITY)
Admission: RE | Admit: 2014-09-29 | Discharge: 2014-09-29 | Disposition: A | Discharge status: Home / Self Care | Payer: Medicaid Other | Source: Ambulatory Visit | Attending: Cardiovascular Disease | Admitting: Cardiovascular Disease

## 2014-09-29 ENCOUNTER — Other Ambulatory Visit (HOSPITAL_COMMUNITY)
Admission: RE | Admit: 2014-09-29 | Discharge: 2014-09-29 | Disposition: A | Discharge status: Home / Self Care | Payer: Medicaid Other | Source: Ambulatory Visit | Attending: Family Medicine | Admitting: Family Medicine

## 2014-09-29 VITALS — BP 131/73 | HR 79 | Ht 62.0 in | Wt 248.2 lb

## 2014-09-29 DIAGNOSIS — N9489 Other specified conditions associated with female genital organs and menstrual cycle: Secondary | ICD-10-CM

## 2014-09-29 DIAGNOSIS — N3946 Mixed incontinence: Secondary | ICD-10-CM

## 2014-09-29 DIAGNOSIS — N898 Other specified noninflammatory disorders of vagina: Secondary | ICD-10-CM

## 2014-09-29 DIAGNOSIS — Z01419 Encounter for gynecological examination (general) (routine) without abnormal findings: Secondary | ICD-10-CM | POA: Diagnosis not present

## 2014-09-29 DIAGNOSIS — N938 Other specified abnormal uterine and vaginal bleeding: Secondary | ICD-10-CM

## 2014-09-29 DIAGNOSIS — Z0000 Encounter for general adult medical examination without abnormal findings: Secondary | ICD-10-CM

## 2014-09-29 DIAGNOSIS — Z1231 Encounter for screening mammogram for malignant neoplasm of breast: Secondary | ICD-10-CM | POA: Insufficient documentation

## 2014-09-29 DIAGNOSIS — Z113 Encounter for screening for infections with a predominantly sexual mode of transmission: Secondary | ICD-10-CM | POA: Insufficient documentation

## 2014-09-29 DIAGNOSIS — Z1151 Encounter for screening for human papillomavirus (HPV): Secondary | ICD-10-CM | POA: Insufficient documentation

## 2014-09-29 NOTE — Progress Notes (Signed)
Subjective:     [name] [name] is a 43 y.o. female and is here for a comprehensive physical exam. The patient reports dysfunctional uterine bleeding. Her initial cycle is approximately 28 days long, but bleeds for approximately 3 weeks of those 28 days. She has had this amount of bleeding for a long time and does not remember the last time she had a normal cycle. She has severe cramping and flow during the first several days of her menses. She will often bleed around her pad. Additionally, she complains of foul that is fairly constant despite bathing. She denies vaginal discharge. Additionally she describes mixed incontinence - both with stress ( Valsalva) and overflow incontinence (frequency, urgency) that occurs often, which her primary care physician is aware of.  History   Social History  . Marital Status: Single    Spouse Name: N/A    Number of Children: 2  . Years of Education: hs   Occupational History  . homemaker    Social History Main Topics  . Smoking status: Current Every Day Smoker -- 1.00 packs/day  . Smokeless tobacco: Never Used  . Alcohol Use: Yes     Comment: occasional  . Drug Use: Yes    Special: Marijuana  . Sexual Activity: Not on file   Other Topics Concern  . Not on file   Social History Narrative   Health Maintenance  Topic Date Due  . PAP SMEAR  04/14/1987  . TETANUS/TDAP  04/13/1988  . INFLUENZA VACCINE  06/24/2014    The following portions of the patient's history were reviewed and updated as appropriate: allergies, current medications, past family history, past medical history, past social history, past surgical history and problem list.  Review of Systems Pertinent items are noted in HPI.   Objective:    BP 131/73 mmHg  Pulse 79  Ht 5\' 2"  (1.575 m)  Wt 248 lb 3.2 oz (112.583 kg)  BMI 45.38 kg/m2  LMP 09/21/2014 (Approximate) General appearance: alert, cooperative and no distress Head: Normocephalic, without obvious abnormality,  atraumatic Throat: lips, mucosa, and tongue normal; teeth and gums normal Lungs: clear to auscultation bilaterally Breasts: normal appearance, no masses or tenderness Heart: regular rate and rhythm, S1, S2 normal, no murmur, click, rub or gallop Abdomen: soft, non-tender; bowel sounds normal; no masses,  no organomegaly Pelvic: cervix normal in appearance, external genitalia normal, no adnexal masses or tenderness, no cervical motion tenderness, rectovaginal septum normal, uterus normal size, shape, and consistency and vagina normal without discharge Extremities: extremities normal, atraumatic, no cyanosis or edema Pulses: 2+ and symmetric Skin: Skin color, texture, turgor normal. No rashes or lesions Lymph nodes: Cervical, supraclavicular, and axillary nodes normal. Neurologic: Alert and oriented X 3, normal strength and tone. Normal symmetric reflexes. Normal coordination and gait    Assessment:    Healthy female exam. See below      Plan:   Problem List Items Addressed This Visit    None    Visit Diagnoses    Health maintenance examination    -  Primary    Relevant Orders       Cytology - PAP    Dysfunctional uterine bleeding        Relevant Orders       TSH       Hemoglobin A1C       US Pelvis Complete    Vaginal odor        Relevant Orders       Wet prep, genital  Mixed incontinence          Pap smear obtained today. For her dysfunctional uterine bleeding, will obtain TSH and hemoglobin A1c. We'll also obtain ultrasound to evaluate for fibroids. A wet prep was also obtained today. Will run gonorrhea chlamydia off from this Pap smear. Recommend urology referral for the next incontinence. We'll have patient follow-up and proximally 6 weeks

## 2014-09-29 NOTE — Progress Notes (Deleted)
  Subjective:     [name] [name] is a 43 y.o. female and is here for a comprehensive physical exam. The patient reports bleeding.  Foul odor.  Mixed incontinence.  History   Social History  . Marital Status: Single    Spouse Name: N/A    Number of Children: 2  . Years of Education: hs   Occupational History  . homemaker    Social History Main Topics  . Smoking status: Current Every Day Smoker -- 1.00 packs/day  . Smokeless tobacco: Never Used  . Alcohol Use: Yes     Comment: occasional  . Drug Use: Yes    Special: Marijuana  . Sexual Activity: Not on file   Other Topics Concern  . Not on file   Social History Narrative   Health Maintenance  Topic Date Due  . PAP SMEAR  04/14/1987  . TETANUS/TDAP  04/13/1988  . INFLUENZA VACCINE  06/24/2014    {Common ambulatory SmartLinks:19316}  Review of Systems {ros; complete:30496}   Objective:    {Exam, Complete:[phone]}    Assessment:    Healthy female exam. ***     Plan:     See After Visit Summary for Counseling Recommendations

## 2014-09-30 LAB — WET PREP, GENITAL
Trich, Wet Prep: NONE SEEN
Yeast Wet Prep HPF POC: NONE SEEN

## 2014-10-02 ENCOUNTER — Other Ambulatory Visit: Payer: Self-pay | Admitting: Family Medicine

## 2014-10-02 ENCOUNTER — Telehealth: Payer: Self-pay

## 2014-10-02 LAB — CYTOLOGY - PAP

## 2014-10-02 MED ORDER — METRONIDAZOLE 500 MG PO TABS: 500.0000 mg | ORAL_TABLET | Freq: Two times a day (BID) | ORAL | Status: DC

## 2014-10-02 NOTE — Telephone Encounter (Signed)
-----   Message from Truett Mainland, DO sent at 10/02/2014 10:38 AM EST ----- Has BV.  Script sent to pharmacy - metronidazole BID x 7 days.  Cannot drink alcohol with this.  Please let pt know.

## 2014-10-02 NOTE — Telephone Encounter (Signed)
Attempted to contact patient. Husband answered and stated that she is in the doctor's right now. Asked that he have patient call when she has a chance so that we may inform her of results. He stated he would.

## 2014-10-03 NOTE — Telephone Encounter (Signed)
Patient called back and left message stating she is returning our phone call. Called patient and informed her of results and what BV is and that a medication has been sent to her pharmacy that she will take twice a day for a week and should not consume alcohol while on this medication. Patient verbalized understanding and asked if this was why she has had an discharge with odor for so long and where did this come from. Told patient that this infection does cause a d/c with odor and that there is no single reason of why she got this but it occurs when bad bacteria overgrow the good bacteria. Patient verbalized understanding and asked if her fiancee needs to wear a condom and if this is sexually transmitted. Told patient no that she cannot spread this to her partner. Patient verbalized understanding and stated that she has an appt in 6 weeks, should she come in sooner than that for follow up on this. Told patient no that this isn't something we are concerned about and that the medication we have sent to her pharmacy will treat this. Patient verbalized understanding and asked if we would call her with her other results. Told patient that we will only contact her if the results are abnormal. Patient verbalized understanding and had no other questions

## 2014-10-04 ENCOUNTER — Ambulatory Visit (HOSPITAL_COMMUNITY): Admission: RE | Admit: 2014-10-04 | Payer: Medicaid Other | Source: Ambulatory Visit

## 2014-10-06 ENCOUNTER — Other Ambulatory Visit: Payer: Self-pay | Admitting: Family Medicine

## 2014-10-06 DIAGNOSIS — N938 Other specified abnormal uterine and vaginal bleeding: Secondary | ICD-10-CM

## 2014-10-10 ENCOUNTER — Telehealth: Payer: Self-pay | Admitting: *Deleted

## 2014-10-10 NOTE — Telephone Encounter (Addendum)
-----   Message from Truett Mainland, DO sent at 10/10/2014  8:53 AM EST ----- The patient was suppose to get TSH and HgA1c done for me, but hasn't yet.  Can you see if she has gotten them done?  ----- Message -----    From: SYSTEM    Sent: 10/04/2014  12:04 AM      To: Truett Mainland, DO   Called pt and left message that I am calling with a question from the doctor.  Please call back and state whether a detailed message can be left on her voice mail.   **Pt will need these labs done prior to next visit on 12/17.  Jeronimo Hellberg RNC 1025  Pt left message stating that a detailed message can be left on her voice mail if she does not answer the return call.  I called her back and explained the reason for my call.  She stated that when she came to the hospital on 11/9 for her Mammogram, she had blood drawn at the lab on the first floor. She reported that her PCP needed some tests done also and she had 7 tubes of blood taken at that time. I said that I would research the situation and call her back. She stated that a detailed message may be left on her voice mail when I call back.   Malasia Torain [facility] 11/18  1208  Called pt and left message that I am not able to verify that she had labs done on 11/9 as she stated. She will need to come to our office to have the labs done prior to her next appt on 12/17. We can do the lab tests tomorrow either before or after her [place] which is scheduled @ 1345.  Please call our office and leave a message stating when she would like to have the labs done. Iowa Kappes RNC

## 2014-10-11 NOTE — Telephone Encounter (Signed)
Patient returned call and stated she had some blood work done for PCP along with RX from Dr. Nehemiah Settle for blood work and all results were sent to PCP [phone]) Dr. Tish Frederickson. Patient states she spoke to RN who was going to fax results. Informed patient nothing has been received. Patient states she may try to go by their office tomorrow but if not will come down after ultrasound to get labs drawn to save herself the energy of searching for results. Informed patient that would be OK.

## 2014-10-12 ENCOUNTER — Ambulatory Visit (HOSPITAL_COMMUNITY)
Admission: RE | Admit: 2014-10-12 | Discharge: 2014-10-12 | Disposition: A | Discharge status: Home / Self Care | Payer: Medicaid Other | Source: Ambulatory Visit | Attending: Family Medicine | Admitting: Family Medicine

## 2014-10-12 ENCOUNTER — Other Ambulatory Visit: Payer: Medicaid Other

## 2014-10-12 DIAGNOSIS — D252 Subserosal leiomyoma of uterus: Secondary | ICD-10-CM | POA: Insufficient documentation

## 2014-10-12 DIAGNOSIS — N938 Other specified abnormal uterine and vaginal bleeding: Secondary | ICD-10-CM

## 2014-10-12 LAB — HEMOGLOBIN A1C
Hgb A1c MFr Bld: 6.2 % — ABNORMAL HIGH (ref <5.7)
Mean Plasma Glucose: 131 mg/dL — ABNORMAL HIGH (ref <117)

## 2014-10-12 LAB — TSH: TSH: 2.958 u[IU]/mL (ref 0.350–4.500)

## 2014-10-26 ENCOUNTER — Encounter (HOSPITAL_COMMUNITY): Payer: Self-pay | Admitting: Emergency Medicine

## 2014-10-26 ENCOUNTER — Emergency Department (HOSPITAL_COMMUNITY): Payer: Medicaid Other

## 2014-10-26 ENCOUNTER — Emergency Department (HOSPITAL_COMMUNITY)
Admission: EM | Admit: 2014-10-26 | Discharge: 2014-10-26 | Disposition: A | Discharge status: Home / Self Care | Payer: Medicaid Other | Attending: Emergency Medicine | Admitting: Emergency Medicine

## 2014-10-26 DIAGNOSIS — Z720 Tobacco use: Secondary | ICD-10-CM | POA: Diagnosis not present

## 2014-10-26 DIAGNOSIS — M797 Fibromyalgia: Secondary | ICD-10-CM | POA: Insufficient documentation

## 2014-10-26 DIAGNOSIS — R0789 Other chest pain: Secondary | ICD-10-CM | POA: Diagnosis not present

## 2014-10-26 DIAGNOSIS — G43109 Migraine with aura, not intractable, without status migrainosus: Secondary | ICD-10-CM | POA: Insufficient documentation

## 2014-10-26 DIAGNOSIS — J45901 Unspecified asthma with (acute) exacerbation: Secondary | ICD-10-CM | POA: Diagnosis not present

## 2014-10-26 DIAGNOSIS — R079 Chest pain, unspecified: Secondary | ICD-10-CM | POA: Diagnosis present

## 2014-10-26 DIAGNOSIS — Z79899 Other long term (current) drug therapy: Secondary | ICD-10-CM | POA: Diagnosis not present

## 2014-10-26 DIAGNOSIS — I10 Essential (primary) hypertension: Secondary | ICD-10-CM | POA: Diagnosis not present

## 2014-10-26 DIAGNOSIS — G8929 Other chronic pain: Secondary | ICD-10-CM | POA: Diagnosis not present

## 2014-10-26 DIAGNOSIS — K219 Gastro-esophageal reflux disease without esophagitis: Secondary | ICD-10-CM | POA: Insufficient documentation

## 2014-10-26 DIAGNOSIS — Z792 Long term (current) use of antibiotics: Secondary | ICD-10-CM | POA: Diagnosis not present

## 2014-10-26 DIAGNOSIS — E669 Obesity, unspecified: Secondary | ICD-10-CM | POA: Insufficient documentation

## 2014-10-26 DIAGNOSIS — F419 Anxiety disorder, unspecified: Secondary | ICD-10-CM

## 2014-10-26 DIAGNOSIS — G4733 Obstructive sleep apnea (adult) (pediatric): Secondary | ICD-10-CM | POA: Diagnosis not present

## 2014-10-26 LAB — CBC WITH DIFFERENTIAL/PLATELET
Basophils Absolute: 0 10*3/uL (ref 0.0–0.1)
Basophils Relative: 1 % (ref 0–1)
Eosinophils Absolute: 0.1 10*3/uL (ref 0.0–0.7)
Eosinophils Relative: 1 % (ref 0–5)
HCT: 43 % (ref 36.0–46.0)
Hemoglobin: 14.4 g/dL (ref 12.0–15.0)
Lymphocytes Relative: 38 % (ref 12–46)
Lymphs Abs: 2.6 10*3/uL (ref 0.7–4.0)
MCH: 29.4 pg (ref 26.0–34.0)
MCHC: 33.5 g/dL (ref 30.0–36.0)
MCV: 87.9 fL (ref 78.0–100.0)
Monocytes Absolute: 0.8 10*3/uL (ref 0.1–1.0)
Monocytes Relative: 11 % (ref 3–12)
Neutro Abs: 3.4 10*3/uL (ref 1.7–7.7)
Neutrophils Relative %: 49 % (ref 43–77)
Platelets: 234 10*3/uL (ref 150–400)
RBC: 4.89 MIL/uL (ref 3.87–5.11)
RDW: 13 % (ref 11.5–15.5)
WBC: 6.8 10*3/uL (ref 4.0–10.5)

## 2014-10-26 LAB — I-STAT TROPONIN, ED: Troponin i, poc: 0 ng/mL (ref 0.00–0.08)

## 2014-10-26 LAB — BASIC METABOLIC PANEL
Anion gap: 14 (ref 5–15)
BUN: 6 mg/dL (ref 6–23)
CO2: 24 mEq/L (ref 19–32)
Calcium: 9.5 mg/dL (ref 8.4–10.5)
Chloride: 99 mEq/L (ref 96–112)
Creatinine, Ser: 0.59 mg/dL (ref 0.50–1.10)
GFR calc Af Amer: >90 mL/min (ref >90)
GFR calc non Af Amer: >90 mL/min (ref >90)
Glucose, Bld: 141 mg/dL — ABNORMAL HIGH (ref 70–99)
Potassium: 3.8 mEq/L (ref 3.7–5.3)
Sodium: 137 mEq/L (ref 137–147)

## 2014-10-26 NOTE — ED Provider Notes (Addendum)
TIME SEEN: 6:30 PM  CHIEF COMPLAINT: Chest pain, shortness of breath  HPI: Pt is a 43 y.o. F with history of hypertension, hyperlipidemia, fibromyalgia, anxiety who presents to the emergency department with complaints of 3 months of intermittent chest pain that has been constant over the past 3 days. Chest pain is described as a "electrical sensation" in the left side of her chest without radiation. She does have associated shortness of breath. No nausea, vomiting, diaphoresis or dizziness. No calf pain or swelling. No fevers or cough. She has seen her cardiologist Dr. Doylene Canard for this who does not feel per her report this is cardiac in nature. She denies a prior history of stress test. Denies a history of cardiac catheterization. Denies a her pain is exertional or pleuritic. She states that she has chronic pain and was on Roxicodone 30 mg 4 times a day. Her doctor recently decreased this to 20 mg 4 times a day. She states that she is on Xanax chronically as well. She reports she has run out of her oxycodone.  ROS: See HPI Constitutional: no fever  Eyes: no drainage  ENT: no runny nose   Cardiovascular:   chest pain  Resp: SOB  GI: no vomiting GU: no dysuria Integumentary: no rash  Allergy: no hives  Musculoskeletal: no leg swelling  Neurological: no slurred speech ROS otherwise negative  PAST MEDICAL HISTORY/PAST SURGICAL HISTORY:  Past Medical History  Diagnosis Date  . Abdominal pain   . Nausea and vomiting   . Hypertension   . High cholesterol   . Back pain   . Fibromyalgia   . Chronic back pain   . Asthma   . Blood transfusion 1989  . Obesity   . Migraine without aura, without mention of intractable migraine without mention of status migrainosus 12/09/2013  . Gastroesophageal reflux disease   . OSA (obstructive sleep apnea)     MEDICATIONS:  Prior to Admission medications   Medication Sig Start Date End Date Taking? Authorizing Provider  albuterol (PROVENTIL HFA;VENTOLIN  HFA) 108 (90 BASE) MCG/ACT inhaler Inhale 2 puffs into the lungs every 6 (six) hours as needed. For shortness of breath     Historical Provider, MD  ALPRAZolam [name]) 1 MG tablet Take 1.5 mg by mouth daily.     Historical Provider, MD  amLODipine (NORVASC) 10 MG tablet Take 10 mg by mouth daily.    Historical Provider, MD  diazepam (VALIUM) 5 MG tablet Take 5 mg by mouth at bedtime.    Historical Provider, MD  diclofenac sodium (VOLTAREN) 1 % GEL Apply 1 application topically 4 (four) times daily as needed. For pain    Historical Provider, MD  lidocaine (XYLOCAINE) 2 % solution Take 5 mLs by mouth as needed (for mouth pain).    Historical Provider, MD  metroNIDAZOLE (FLAGYL) 500 MG tablet Take 1 tablet (500 mg total) by mouth 2 (two) times daily. 10/02/14   Truett Mainland, DO  morphine (MS CONTIN) 60 MG 12 hr tablet Take 60 mg by mouth 3 (three) times daily.     Historical Provider, MD  mupirocin ointment (BACTROBAN) 2 % Apply 1 application topically 3 (three) times daily.      Historical Provider, MD  nystatin (MYCOSTATIN) 100000 UNIT/ML suspension Take 500,000 Units by mouth 4 (four) times daily.      Historical Provider, MD  oxycodone (ROXICODONE) 30 MG immediate release tablet Take 20 mg by mouth every 6 (six) hours as needed for pain.  Historical Provider, MD  phenazopyridine (PYRIDIUM) 100 MG tablet Take 100 mg by mouth 4 (four) times daily.     Historical Provider, MD  pregabalin (LYRICA) 50 MG capsule One capsule twice a day for one week, then take one capsule in the morning and two capsules in the evening 12/09/13   Kathrynn Ducking, MD  promethazine (PHENERGAN) 25 MG tablet Take 25 mg by mouth every 6 (six) hours as needed for nausea or vomiting.    Historical Provider, MD  ranitidine (ZANTAC) 300 MG tablet Take 300 mg by mouth every morning.     Historical Provider, MD  SUMAtriptan (IMITREX) 5 MG/ACT nasal spray Place 1 spray (5 mg total) into the nose 2 (two) times daily as needed. For  migraines 12/09/13   Kathrynn Ducking, MD  tiZANidine (ZANAFLEX) 2 MG tablet Take 2 mg by mouth 3 (three) times daily as needed (pain).    Historical Provider, MD  triamterene-hydrochlorothiazide (MAXZIDE-25) 37.5-25 MG per tablet Take 1 tablet by mouth daily.      Historical Provider, MD    ALLERGIES:  Allergies  Allergen Reactions  . Bee Venom Anaphylaxis  . Eggs Or Egg-Derived Products Anaphylaxis, Swelling and Rash  . Nucynta [Tapentadol] Anaphylaxis, Nausea And Vomiting and Swelling  . Mushroom Extract Complex Nausea And Vomiting    Acid reflux  . Aspirin Swelling and Rash  . Nsaids Swelling and Rash    SOCIAL HISTORY:  History  Substance Use Topics  . Smoking status: Current Every Day Smoker -- 1.00 packs/day  . Smokeless tobacco: Never Used  . Alcohol Use: Yes     Comment: occasional    FAMILY HISTORY: Family History  Problem Relation Age of Onset  . Cancer Mother     brain, breast, lung, colon  . Diabetes Maternal Grandmother   . Heart disease Maternal Grandmother   . Diabetes Maternal Grandfather   . Heart disease Maternal Grandfather   . Diabetes Paternal Grandmother   . Heart disease Paternal Grandmother   . Stroke Paternal Grandmother   . Diabetes Paternal Grandfather   . Heart disease Paternal Grandfather   . Stroke Paternal Grandfather   . Cancer Sister     EXAM: BP 145/81 mmHg  Pulse 99  Temp(Src) 98.2 F (36.8 C)  Resp 16  SpO2 98%  LMP 09/21/2014 (Approximate) CONSTITUTIONAL: Alert and oriented and responds appropriately to questions. Well-appearing; well-nourished HEAD: Normocephalic EYES: Conjunctivae clear, PERRL ENT: normal nose; no rhinorrhea; moist mucous membranes; pharynx without lesions noted NECK: Supple, no meningismus, no LAD  CARD: RRR; S1 and S2 appreciated; no murmurs, no clicks, no rubs, no gallops RESP: Normal chest excursion without splinting or tachypnea; breath sounds clear and equal bilaterally; no wheezes, no rhonchi, no  rales, no hypoxia or respiratory distress, speaking full sentences ABD/GI: Normal bowel sounds; non-distended; soft, non-tender, no rebound, no guarding BACK:  The back appears normal and is non-tender to palpation, there is no CVA tenderness EXT: Normal ROM in all joints; non-tender to palpation; no edema; normal capillary refill; no cyanosis    SKIN: Normal color for age and race; warm NEURO: Moves all extremities equally, normal gait, sensation to light touch intact diffusely, cranial nerves II through XII intact PSYCH: The patient's mood and manner are appropriate. Grooming and personal hygiene are appropriate. Anxious, rapid and pressured speech. No SI or hallucinations. No take gentle thoughts.  MEDICAL DECISION MAKING: Patient here with very atypical chest pain. She does have some risk factors for ACS but  has had constant pain for 3 days. She has discussed this with her cardiologist several times and has not had an outpatient stress test.  Patient appears extremely anxious on exam and is difficult to assess and discuss plan with her she will not stop talking and interrupting multiple health care providers. She does appear to be slightly manic but does not appear to have any current safety psychiatric concerns. We'll obtain cardiac labs, chest x-ray. EKG is nonischemic and unchanged since 2012. Have discussed with patient that I have low suspicion for ACS given the very atypical nature of her pain.  ED PROGRESS: Patient's labs are unremarkable. Chest x-ray clear. Have attempted to reassure patient and her significant other. Have advised her to follow-up with her cardiologist for outpatient follow-up. Discussed return precautions. She verbalizes understanding and is comfortable with plan.      EKG Interpretation  Date/Time:  Thursday October 26 2014 17:23:44 EST Ventricular Rate:  109 PR Interval:  138 QRS Duration: 88 QT Interval:  334 QTC Calculation: 449 R Axis:   6 Text  Interpretation:  Sinus tachycardia Possible Anterior infarct , age undetermined Abnormal ECG No significant change since last tracing 2012 Artifact Confirmed by W[place],  DO, KRISTEN [phone]) on 10/26/2014 6:20:07 PM         EKG Interpretation  Date/Time:  Thursday October 26 2014 18:17:25 EST Ventricular Rate:  102 PR Interval:  161 QRS Duration: 94 QT Interval:  361 QTC Calculation: 470 R Axis:   23 Text Interpretation:  Age not entered, assumed to be  43 years old for purpose of ECG interpretation Sinus tachycardia No significant change since last tracing in 2012 Confirmed by [place],  DO, KRISTEN [id number]) on 10/26/2014 6:20:57 PM        [place], DO 10/27/14 [place], DO 10/27/14 [id number]

## 2014-10-26 NOTE — ED Notes (Signed)
Pt is in a gown and on the monitor. Pt asked multiple times if she would like a wheel chair.  Pt walked back to the room from triage she refused to get in a wheel chair pt stated that she stays in the bed when she is home and that when she can she wants to walk.

## 2014-10-26 NOTE — ED Notes (Signed)
Pt c/o left sided CP and SOB with anxiety; pt sts out of her pain meds x 3 days

## 2014-10-26 NOTE — Discharge Instructions (Signed)
Chest Pain (Nonspecific) °It is often hard to give a specific diagnosis for the cause of chest pain. There is always a chance that your pain could be related to something serious, such as a heart attack or a blood clot in the lungs. You need to follow up with your health care provider for further evaluation. °CAUSES  °· Heartburn. °· Pneumonia or bronchitis. °· Anxiety or stress. °· Inflammation around your heart (pericarditis) or lung (pleuritis or pleurisy). °· A blood clot in the lung. °· A collapsed lung (pneumothorax). It can develop suddenly on its own (spontaneous pneumothorax) or from trauma to the chest. °· Shingles infection (herpes zoster virus). °The chest wall is composed of bones, muscles, and cartilage. Any of these can be the source of the pain. °· The bones can be bruised by injury. °· The muscles or cartilage can be strained by coughing or overwork. °· The cartilage can be affected by inflammation and become sore (costochondritis). °DIAGNOSIS  °Lab tests or other studies may be needed to find the cause of your pain. Your health care provider may have you take a test called an ambulatory electrocardiogram (ECG). An ECG records your heartbeat patterns over a 24-hour period. You may also have other tests, such as: °· Transthoracic echocardiogram (TTE). During echocardiography, sound waves are used to evaluate how blood flows through your heart. °· Transesophageal echocardiogram (TEE). °· Cardiac monitoring. This allows your health care provider to monitor your heart rate and rhythm in real time. °· Holter monitor. This is a portable device that records your heartbeat and can help diagnose heart arrhythmias. It allows your health care provider to track your heart activity for several days, if needed. °· Stress tests by exercise or by giving medicine that makes the heart beat faster. °TREATMENT  °· Treatment depends on what may be causing your chest pain. Treatment may include: °¨ Acid blockers for  heartburn. °¨ Anti-inflammatory medicine. °¨ Pain medicine for inflammatory conditions. °¨ Antibiotics if an infection is present. °· You may be advised to change lifestyle habits. This includes stopping smoking and avoiding alcohol, caffeine, and chocolate. °· You may be advised to keep your head raised (elevated) when sleeping. This reduces the chance of acid going backward from your stomach into your esophagus. °Most of the time, nonspecific chest pain will improve within 2-3 days with rest and mild pain medicine.  °HOME CARE INSTRUCTIONS  °· If antibiotics were prescribed, take them as directed. Finish them even if you start to feel better. °· For the next few days, avoid physical activities that bring on chest pain. Continue physical activities as directed. °· Do not use any tobacco products, including cigarettes, chewing tobacco, or electronic cigarettes. °· Avoid drinking alcohol. °· Only take medicine as directed by your health care provider. °· Follow your health care provider's suggestions for further testing if your chest pain does not go away. °· Keep any follow-up appointments you made. If you do not go to an appointment, you could develop lasting (chronic) problems with pain. If there is any problem keeping an appointment, call to reschedule. °SEEK MEDICAL CARE IF:  °· Your chest pain does not go away, even after treatment. °· You have a rash with blisters on your chest. °· You have a fever. °SEEK IMMEDIATE MEDICAL CARE IF:  °· You have increased chest pain or pain that spreads to your arm, neck, jaw, back, or abdomen. °· You have shortness of breath. °· You have an increasing cough, or you cough   up blood. °· You have severe back or abdominal pain. °· You feel nauseous or vomit. °· You have severe weakness. °· You faint. °· You have chills. °This is an emergency. Do not wait to see if the pain will go away. Get medical help at once. Call your local emergency services (911 in [place] Do not drive  yourself to the hospital. °MAKE SURE YOU:  °· Understand these instructions. °· Will watch your condition. °· Will get help right away if you are not doing well or get worse. °Document Released: 08/20/2005 Document Revised: 11/15/2013 Document Reviewed: 06/15/2008 °ExitCare® Patient Information ©2015 [employer]. This information is not intended to replace advice given to you by your health care provider. Make sure you discuss any questions you have with your health care provider. ° ° °Emergency Department Resource Guide °1) Find a Doctor and Pay Out of Pocket °Although you won't have to find out who is covered by your insurance plan, it is a good idea to ask around and get recommendations. You will then need to call the office and see if the doctor you have chosen will accept you as a new patient and what types of options they offer for patients who are self-pay. Some doctors offer discounts or will set up payment plans for their patients who do not have insurance, but you will need to ask so you aren't surprised when you get to your appointment. ° °2) Contact Your Local Health Department °Not all health departments have doctors that can see patients for sick visits, but many do, so it is worth a call to see if yours does. If you don't know where your local health department is, you can check in your phone book. The CDC also has a tool to help you locate your state's health department, and many state websites also have listings of all of their local health departments. ° °3) Find a Walk-in Clinic °If your illness is not likely to be very severe or complicated, you may want to try a walk in clinic. These are popping up all over the country in pharmacies, drugstores, and shopping centers. They're usually staffed by nurse practitioners or physician assistants that have been trained to treat common illnesses and complaints. They're usually fairly quick and inexpensive. However, if you have serious medical issues or  chronic medical problems, these are probably not your best option. ° °No Primary Care Doctor: °- Call [facility] at  832-8000 - they can help you locate a primary care doctor that  accepts your insurance, provides certain services, etc. °- Physician Referral Service- 1-800-533-3463 ° °Chronic Pain Problems: °Organization         Address  Phone   Notes  °[facility]  (336) 297-2271 Patients need to be referred by their primary care doctor.  ° °Medication Assistance: °Organization         Address  Phone   Notes  °[place] [facility] [address] °[place], [place] [address] (336) 641-8030 --Must be a resident of [place] °-- Must have NO insurance coverage whatsoever (no Medicaid/ Medicare, etc.) °-- The pt. MUST have a primary care doctor that directs their care regularly and follows them in the community °  °[facility]  (866) 331-1348   °[employer]  (888) 892-1162   ° °Agencies that provide inexpensive medical care: °Organization         Address  Phone   Notes  °[facility]  (  336) 832-8035   °[facility]    (336) 832-7272   °[facility] [address] °[place], [place] [address] (336) 832-4777   °[facility] [address], °[place] (336) 271-4999   °[facility]    (336) 373-0678   °[facility]    (336) 272-1050   °[facility] ° [address], [place] Phone:  (336) 832-4444, Fax:  (336) 832-4440 Hours of Operation:  9 am - 6 pm, M-F.  Also accepts Medicaid/Medicare and self-pay.  °[facility] ° [address], [place] Phone: (336) 832-3150, Fax: (336) [phone]. Hours of Operation:  8:30 am - 5:30 pm, M-F.  Also accepts Medicaid and self-pay.  °[facility] [place] [address], [place] Phone: (336) 878-6027   °[facility] [address], [place], [place]  (336)723-1848, Ext. 123 Mondays & Thursdays: 7-9 AM.  First 15 patients are seen on a first come, first serve basis. °  ° °Medicaid-accepting [place] Providers: ° °Organization         Address  Phone   Notes  °[facility] [address], [place] (336) 641-2100 Also accepts self-pay patients.  °[facility] [address], [place] ° (336) 856-9996   °[facility] [address], [place] (336) 288-8857   °[facility] [address] [place] [address], [place] (336) 299-7000   °Veita Bland [address], [place]  ° (336) 373-1557 Only accepts [place] Access Medicaid patients after they have their name applied to their card.  ° °Self-Pay (no insurance) in [place]: ° °Organization         Address  Phone   Notes  °Sickle Cell Patients, [facility] [address], [place] (336) 832-1970   °[facility] [address], [place] (336) 832-4400   °[facility] ° [address], [place] (336) 992-4800   °[facility]/Dr. Osei-Bonsu ° [address] [place] [address], [place] or [address], [place] (336) [phone] Phone number for both [place] and [place] locations is the same.  °[facility] [address], [place] (336) 299-0000   °[facility] [address] [place] [address], [place] or [address] (336) 852-7530 °(336) 878-2260   °[facility] [address], [place] (336) 350-1642, phone; (336) 294-5005, fax Sees patients 1st and 3rd Saturday of every month.  Must not qualify for public or private insurance (i.e. Medicaid, Medicare, [place] Health Choice, Veterans' Benefits) • Household income should be no more than 200% of the poverty level •The clinic cannot treat you if you are pregnant or think you are pregnant • Sexually transmitted  diseases are not treated at the clinic.  ° ° °Dental Care: °Organization         Address  Phone  Notes  °[facility] [address], [place] (336) 641-6152 Accepts children up to age 21 who are enrolled in Medicaid or [place] Health Choice; pregnant women with a Medicaid card; and children who have applied for Medicaid or [place] Health Choice, but were declined, whose parents can pay a reduced fee at time of service.  °[facility] [place]    [address], [place] (336) 641-7733 Accepts children up to age 21 who are enrolled in Medicaid or [place] Health Choice; pregnant women with a Medicaid card; and children who have applied for Medicaid or [place] Health Choice, but were declined, whose parents can pay a reduced fee at time of service.  °[facility] ° [address], [place] (336) 641-4533 Patients are seen by appointment only. Walk-ins are not accepted. [facility] will see patients 18 years of age and older. °Monday - Tuesday (8am-5pm) °Most Wednesdays (8:30-5pm) °$30 per visit, cash only  °[facility] ° [address], [place] (336) 641-4533 Patients are seen by appointment only. Walk-ins are not accepted. [facility] will see patients 18 years of age and older. °One Wednesday Evening (Monthly: Volunteer Based).  $30 per visit, cash only  °[facility]  (919) [phone] for adults; Children under age 4, call Graduate Pediatric Dentistry at ([phone]) [phone]. Children aged 4-14, please call ([phone]) [phone] to request a pediatric application. ° Dental services are provided in all areas of dental care including fillings, crowns and bridges, complete and partial dentures, implants, gum treatment, root canals, and extractions. Preventive care is also provided. Treatment is provided to both adults and children. °Patients are selected via a  lottery and there is often a waiting list. °  °[facility] [address], °[place] ° (336) 763-8833 [email or web address] °  °[facility] [address], [place], [place] (336)723-1848, Ext. 123 Second and Fourth Thursday of each month, opens at 6:30 AM; Clinic ends at 9 AM.  Patients are seen on a first-come first-served basis, and a limited number are seen during each clinic.  ° °[facility] ° [address], [place], [place] (336) [phone]   Eligibility Requirements °You must have lived in [place], [place], or [place] counties for at least the last three months. °  You cannot be eligible for state or federal sponsored healthcare insurance, including Veterans Administration, Medicaid, or Medicare. °  You generally cannot be eligible for healthcare insurance through your employer.  °  How to apply: °Eligibility screenings are held every Tuesday and Wednesday afternoon from 1:00 pm until 4:00 pm. You do not need an appointment for the interview!  °[facility] [address], [place], [place] 336-631-2330   °[facility]  336-342-8273   °[facility]  336-703-3100   °[facility]  336-570-6415   ° °Behavioral Health Resources in the Community: °Intensive Outpatient Programs °Organization         Address  Phone  Notes  °[facility] [address], [place], [place] 336-878-6098   °[facility] [address], [place], [place] 336-832-9800   °[facility] [address], [place], [place] ° 336-882-2125   °[facility] [address],  °[place],  1-800-853-5163 or 336-641-4981   °Substance Abuse Resources °Organization         Address  Phone  Notes  °[facility]  336-882-2125   °[facility]  336-784-9470   °[facility]  336-285-9073   °[facility]  336-845-3988   °Residential &  Outpatient Substance Abuse Program  1-800-659-3381   °Psychological Services °Organization         Address  Phone  Notes  °[facility]  336- 832-9600   °  [facility]  336- 378-7881   °[facility] [address], [place] 1-800-853-5163 or 336-641-4981   ° °Mobile Crisis Teams °Organization         Address  Phone  Notes  °[facility], Mobile Crisis Care Unit  1-877-626-1772   °Assertive °[facility] ° [address] [place], [place] 336-834-9664   °Sharon DeEsch [address] °[place] 336-554-5454   ° °Self-Help/Support Groups °Organization         Address  Phone             Notes  °[facility] [place] - variety of support groups  336- 373-1402 Call for more information  °Narcotics Anonymous (NA), [facility] [address], °[place]  2 meetings at this location  ° °Residential Treatment Programs °Organization         Address  Phone  Notes  °[facility] [address],    °[place]  1-866-801-8205   °[facility] ° [address], [place], [place] 704-293-8524   °[facility] [address], [place] 336-845-3988 Admissions: 8am-3pm M-F  °[facility] [address]    °[place], [place] 336-841-1104   °[facility] [address], [place], [place] 336-379-7146   °[facility] [address]  °[place], [place] 336-285-9073   °[facility] Intensive Outpatient [address], [place], [place] 336-852-3033   °[facility] [address]  °[place], [place] 1-877-615-2722 or 336-784-9470   °[facility] [address] [place], [place] 336-227-7417 Accepts Medicaid  °[facility] [address]  ° [place] 1-800-659-3381 Substance Abuse/Addiction Treatment  ° °[place] Behavioral Health Resources °Organization          Address  Phone  Notes  °[facility]  (888) 581-9988   °Julie Brannon, PhD [address] [place], [place]   (336) 349-5553 or (336) 951-0000   °[facility]   [address] °[place], [place] (336) 349-4454   °[facility] [address], [place], [place] (336) 342-8316 Insurance/Medicaid/sponsorship through [facility]  °[facility] [address]                                    [place], [place] (336) 342-8316 Therapy/tele-psych/case  °[facility] [address]  ° [place], [place] (336) 349-2233    °Dr. Arfeen  (336) 349-4544   °[facility] [place]  [employer] [place] [facility] 1) [address],  °2) [address], [place] °3)  [address], [place] (336) 349-3220 °(336) 342-7768 ° °(336) 342-8140   °[place] [facility] (336) 342-1394 or (336) 342-3537 (After Hours)    ° ° ° °

## 2014-10-27 ENCOUNTER — Observation Stay (HOSPITAL_COMMUNITY)
Admission: AD | Admit: 2014-10-27 | Discharge: 2014-10-29 | Disposition: A | Discharge status: Home / Self Care | Payer: Medicaid Other | Source: Ambulatory Visit | Attending: Cardiovascular Disease | Admitting: Cardiovascular Disease

## 2014-10-27 DIAGNOSIS — G4733 Obstructive sleep apnea (adult) (pediatric): Secondary | ICD-10-CM | POA: Diagnosis not present

## 2014-10-27 DIAGNOSIS — R079 Chest pain, unspecified: Principal | ICD-10-CM | POA: Insufficient documentation

## 2014-10-27 DIAGNOSIS — K219 Gastro-esophageal reflux disease without esophagitis: Secondary | ICD-10-CM | POA: Insufficient documentation

## 2014-10-27 DIAGNOSIS — F419 Anxiety disorder, unspecified: Secondary | ICD-10-CM | POA: Diagnosis not present

## 2014-10-27 DIAGNOSIS — E780 Pure hypercholesterolemia: Secondary | ICD-10-CM | POA: Diagnosis not present

## 2014-10-27 DIAGNOSIS — J449 Chronic obstructive pulmonary disease, unspecified: Secondary | ICD-10-CM | POA: Insufficient documentation

## 2014-10-27 DIAGNOSIS — G43909 Migraine, unspecified, not intractable, without status migrainosus: Secondary | ICD-10-CM | POA: Insufficient documentation

## 2014-10-27 DIAGNOSIS — M549 Dorsalgia, unspecified: Secondary | ICD-10-CM

## 2014-10-27 DIAGNOSIS — I10 Essential (primary) hypertension: Secondary | ICD-10-CM | POA: Insufficient documentation

## 2014-10-27 DIAGNOSIS — M2550 Pain in unspecified joint: Secondary | ICD-10-CM | POA: Insufficient documentation

## 2014-10-27 DIAGNOSIS — Z792 Long term (current) use of antibiotics: Secondary | ICD-10-CM | POA: Diagnosis not present

## 2014-10-27 DIAGNOSIS — E669 Obesity, unspecified: Secondary | ICD-10-CM | POA: Insufficient documentation

## 2014-10-27 DIAGNOSIS — G8929 Other chronic pain: Secondary | ICD-10-CM | POA: Insufficient documentation

## 2014-10-27 DIAGNOSIS — M797 Fibromyalgia: Secondary | ICD-10-CM | POA: Diagnosis not present

## 2014-10-27 DIAGNOSIS — Z79899 Other long term (current) drug therapy: Secondary | ICD-10-CM | POA: Diagnosis not present

## 2014-10-27 HISTORY — DX: Anxiety disorder, unspecified: F41.9

## 2014-10-27 HISTORY — DX: Urgency of urination: R39.15

## 2014-10-27 HISTORY — DX: Adverse effect of unspecified anesthetic, initial encounter: T41.45XA

## 2014-10-27 HISTORY — DX: Unspecified urinary incontinence: R32

## 2014-10-27 HISTORY — DX: Other complications of anesthesia, initial encounter: T88.59XA

## 2014-10-27 HISTORY — DX: Urinary tract infection, site not specified: N39.0

## 2014-10-27 HISTORY — DX: Unspecified chronic bronchitis: J42

## 2014-10-27 HISTORY — DX: Unspecified osteoarthritis, unspecified site: M19.90

## 2014-10-27 HISTORY — DX: Other amnesia: R41.3

## 2014-10-27 HISTORY — DX: Angina pectoris, unspecified: I20.9

## 2014-10-27 HISTORY — DX: Retention of urine, unspecified: R33.9

## 2014-10-27 HISTORY — DX: Chronic sinusitis, unspecified: J32.9

## 2014-10-27 HISTORY — DX: Pneumonia, unspecified organism: J18.9

## 2014-10-27 HISTORY — DX: Anemia, unspecified: D64.9

## 2014-10-27 LAB — CBC WITH DIFFERENTIAL/PLATELET
Basophils Absolute: 0 10*3/uL (ref 0.0–0.1)
Basophils Relative: 1 % (ref 0–1)
Eosinophils Absolute: 0.2 10*3/uL (ref 0.0–0.7)
Eosinophils Relative: 2 % (ref 0–5)
HCT: 39.8 % (ref 36.0–46.0)
Hemoglobin: 13.2 g/dL (ref 12.0–15.0)
Lymphocytes Relative: 44 % (ref 12–46)
Lymphs Abs: 3.7 10*3/uL (ref 0.7–4.0)
MCH: 29.6 pg (ref 26.0–34.0)
MCHC: 33.2 g/dL (ref 30.0–36.0)
MCV: 89.2 fL (ref 78.0–100.0)
Monocytes Absolute: 0.6 10*3/uL (ref 0.1–1.0)
Monocytes Relative: 7 % (ref 3–12)
Neutro Abs: 3.8 10*3/uL (ref 1.7–7.7)
Neutrophils Relative %: 46 % (ref 43–77)
Platelets: 223 10*3/uL (ref 150–400)
RBC: 4.46 MIL/uL (ref 3.87–5.11)
RDW: 13.1 % (ref 11.5–15.5)
WBC: 8.4 10*3/uL (ref 4.0–10.5)

## 2014-10-27 LAB — COMPREHENSIVE METABOLIC PANEL
ALT: 18 U/L (ref 0–35)
AST: 17 U/L (ref 0–37)
Albumin: 3.8 g/dL (ref 3.5–5.2)
Alkaline Phosphatase: 79 U/L (ref 39–117)
Anion gap: 15 (ref 5–15)
BUN: 8 mg/dL (ref 6–23)
CO2: 24 mEq/L (ref 19–32)
Calcium: 9.1 mg/dL (ref 8.4–10.5)
Chloride: 103 mEq/L (ref 96–112)
Creatinine, Ser: 0.71 mg/dL (ref 0.50–1.10)
GFR calc Af Amer: >90 mL/min (ref >90)
GFR calc non Af Amer: >90 mL/min (ref >90)
Glucose, Bld: 180 mg/dL — ABNORMAL HIGH (ref 70–99)
Potassium: 3.3 mEq/L — ABNORMAL LOW (ref 3.7–5.3)
Sodium: 142 mEq/L (ref 137–147)
Total Bilirubin: 0.3 mg/dL (ref 0.3–1.2)
Total Protein: 6.6 g/dL (ref 6.0–8.3)

## 2014-10-27 LAB — TROPONIN I: Troponin I: <0.3 ng/mL (ref <0.30)

## 2014-10-27 MED ORDER — DIAZEPAM 5 MG PO TABS: 5.0000 mg | ORAL_TABLET | Freq: Every day | ORAL | Status: DC

## 2014-10-27 MED ORDER — CARVEDILOL 3.125 MG PO TABS
3.1250 mg | ORAL_TABLET | Freq: Two times a day (BID) | ORAL | Status: DC
  Administered 2014-10-27 – 2014-10-29 (×4): 3.125 mg via ORAL
  Filled 2014-10-27 (×3): qty 1

## 2014-10-27 MED ORDER — HEPARIN BOLUS VIA INFUSION
3800.0000 [IU] | Freq: Once | INTRAVENOUS | Status: AC
  Administered 2014-10-27: 3800 [IU] via INTRAVENOUS
  Filled 2014-10-27: qty 3800

## 2014-10-27 MED ORDER — OXYCODONE HCL 5 MG PO TABS
20.0000 mg | ORAL_TABLET | Freq: Three times a day (TID) | ORAL | Status: DC
  Administered 2014-10-27 – 2014-10-29 (×5): 20 mg via ORAL
  Filled 2014-10-27 (×3): qty 4

## 2014-10-27 MED ORDER — SODIUM CHLORIDE 0.9 % IJ SOLN: 3.0000 mL | INTRAMUSCULAR | Status: DC | PRN

## 2014-10-27 MED ORDER — TRIAMTERENE-HCTZ 37.5-25 MG PO TABS
1.0000 | ORAL_TABLET | Freq: Every day | ORAL | Status: DC
Start: 2014-10-28 — End: 2014-10-29
  Administered 2014-10-28 – 2014-10-29 (×2): 1 via ORAL
  Filled 2014-10-27 (×2): qty 1

## 2014-10-27 MED ORDER — HEPARIN (PORCINE) IN NACL 100-0.45 UNIT/ML-% IJ SOLN
1600.0000 [IU]/h | INTRAMUSCULAR | Status: DC
  Administered 2014-10-27: 900 [IU]/h via INTRAVENOUS
  Administered 2014-10-28: 1300 [IU]/h via INTRAVENOUS

## 2014-10-27 MED ORDER — SODIUM CHLORIDE 0.9 % IJ SOLN
3.0000 mL | Freq: Two times a day (BID) | INTRAMUSCULAR | Status: DC
  Administered 2014-10-27 – 2014-10-28 (×2): 3 mL via INTRAVENOUS

## 2014-10-27 MED ORDER — FAMOTIDINE 20 MG PO TABS
20.0000 mg | ORAL_TABLET | Freq: Every day | ORAL | Status: DC
  Administered 2014-10-28 – 2014-10-29 (×2): 20 mg via ORAL
  Filled 2014-10-27 (×2): qty 1

## 2014-10-27 MED ORDER — TIZANIDINE HCL 2 MG PO TABS
2.0000 mg | ORAL_TABLET | Freq: Every day | ORAL | Status: DC
  Administered 2014-10-27 – 2014-10-28 (×2): 2 mg via ORAL
  Filled 2014-10-27 (×3): qty 1

## 2014-10-27 MED ORDER — ONDANSETRON HCL 4 MG/2ML IJ SOLN: 4.0000 mg | Freq: Four times a day (QID) | INTRAMUSCULAR | Status: DC | PRN

## 2014-10-27 MED ORDER — PROMETHAZINE HCL 25 MG PO TABS: 25.0000 mg | ORAL_TABLET | Freq: Four times a day (QID) | ORAL | Status: DC | PRN

## 2014-10-27 MED ORDER — AMLODIPINE BESYLATE 10 MG PO TABS
10.0000 mg | ORAL_TABLET | Freq: Every day | ORAL | Status: DC
  Administered 2014-10-28 – 2014-10-29 (×2): 10 mg via ORAL
  Filled 2014-10-27 (×2): qty 1

## 2014-10-27 MED ORDER — NITROGLYCERIN 0.4 MG SL SUBL
0.4000 mg | SUBLINGUAL_TABLET | SUBLINGUAL | Status: DC | PRN
  Administered 2014-10-27: 0.4 mg via SUBLINGUAL

## 2014-10-27 MED ORDER — SODIUM CHLORIDE 0.9 % IV SOLN: 250.0000 mL | INTRAVENOUS | Status: DC | PRN

## 2014-10-27 MED ORDER — PREGABALIN 50 MG PO CAPS
50.0000 mg | ORAL_CAPSULE | Freq: Two times a day (BID) | ORAL | Status: DC
  Administered 2014-10-27 – 2014-10-29 (×4): 50 mg via ORAL
  Filled 2014-10-27 (×3): qty 1

## 2014-10-27 MED ORDER — ALPRAZOLAM 0.5 MG PO TABS
0.5000 mg | ORAL_TABLET | Freq: Three times a day (TID) | ORAL | Status: DC | PRN
  Administered 2014-10-27 – 2014-10-29 (×5): 0.5 mg via ORAL
  Filled 2014-10-27 (×4): qty 1

## 2014-10-27 MED ORDER — ACETAMINOPHEN 325 MG PO TABS
650.0000 mg | ORAL_TABLET | ORAL | Status: DC | PRN
  Administered 2014-10-28 (×3): 650 mg via ORAL
  Filled 2014-10-27: qty 2

## 2014-10-27 MED ORDER — ALBUTEROL SULFATE (2.5 MG/3ML) 0.083% IN NEBU
3.0000 mL | INHALATION_SOLUTION | Freq: Four times a day (QID) | RESPIRATORY_TRACT | Status: DC | PRN
  Administered 2014-10-28: 3 mL via RESPIRATORY_TRACT

## 2014-10-27 MED ORDER — ATORVASTATIN CALCIUM 40 MG PO TABS: 40.0000 mg | ORAL_TABLET | Freq: Every day | ORAL | Status: DC

## 2014-10-27 MED ORDER — NICOTINE 14 MG/24HR TD PT24
14.0000 mg | MEDICATED_PATCH | Freq: Every day | TRANSDERMAL | Status: DC
  Administered 2014-10-27 – 2014-10-29 (×3): 14 mg via TRANSDERMAL
  Filled 2014-10-27 (×2): qty 1
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
## 2014-10-28 ENCOUNTER — Encounter (HOSPITAL_COMMUNITY): Payer: Self-pay | Admitting: General Practice

## 2014-10-28 ENCOUNTER — Observation Stay (HOSPITAL_COMMUNITY): Payer: Medicaid Other

## 2014-10-28 ENCOUNTER — Other Ambulatory Visit: Payer: Self-pay

## 2014-10-28 DIAGNOSIS — I10 Essential (primary) hypertension: Secondary | ICD-10-CM | POA: Diagnosis not present

## 2014-10-28 DIAGNOSIS — M797 Fibromyalgia: Secondary | ICD-10-CM | POA: Diagnosis not present

## 2014-10-28 DIAGNOSIS — E780 Pure hypercholesterolemia: Secondary | ICD-10-CM | POA: Diagnosis not present

## 2014-10-28 DIAGNOSIS — R079 Chest pain, unspecified: Secondary | ICD-10-CM | POA: Diagnosis not present

## 2014-10-28 LAB — PROTIME-INR
INR: 1.02 (ref 0.00–1.49)
Prothrombin Time: 13.5 seconds (ref 11.6–15.2)

## 2014-10-28 LAB — BASIC METABOLIC PANEL
Anion gap: 13 (ref 5–15)
BUN: 10 mg/dL (ref 6–23)
CO2: 25 mEq/L (ref 19–32)
Calcium: 9.2 mg/dL (ref 8.4–10.5)
Chloride: 102 mEq/L (ref 96–112)
Creatinine, Ser: 0.7 mg/dL (ref 0.50–1.10)
GFR calc Af Amer: >90 mL/min (ref >90)
GFR calc non Af Amer: >90 mL/min (ref >90)
Glucose, Bld: 135 mg/dL — ABNORMAL HIGH (ref 70–99)
Potassium: 3.9 mEq/L (ref 3.7–5.3)
Sodium: 140 mEq/L (ref 137–147)

## 2014-10-28 LAB — CBC
HCT: 39.2 % (ref 36.0–46.0)
Hemoglobin: 12.7 g/dL (ref 12.0–15.0)
MCH: 29.1 pg (ref 26.0–34.0)
MCHC: 32.4 g/dL (ref 30.0–36.0)
MCV: 89.9 fL (ref 78.0–100.0)
Platelets: 203 10*3/uL (ref 150–400)
RBC: 4.36 MIL/uL (ref 3.87–5.11)
RDW: 13.2 % (ref 11.5–15.5)
WBC: 8.3 10*3/uL (ref 4.0–10.5)

## 2014-10-28 LAB — HEPARIN LEVEL (UNFRACTIONATED)
Heparin Unfractionated: <0.1 IU/mL — ABNORMAL LOW (ref 0.30–0.70)
Heparin Unfractionated: 0.17 IU/mL — ABNORMAL LOW (ref 0.30–0.70)

## 2014-10-28 LAB — LIPID PANEL
Cholesterol: 159 mg/dL (ref 0–200)
HDL: 38 mg/dL — ABNORMAL LOW (ref >39)
LDL Cholesterol: 97 mg/dL (ref 0–99)
Total CHOL/HDL Ratio: 4.2 RATIO
Triglycerides: 122 mg/dL (ref <150)
VLDL: 24 mg/dL (ref 0–40)

## 2014-10-28 LAB — TROPONIN I
Troponin I: <0.3 ng/mL (ref <0.30)
Troponin I: <0.3 ng/mL (ref <0.30)

## 2014-10-28 MED ORDER — LOSARTAN POTASSIUM 50 MG PO TABS
50.0000 mg | ORAL_TABLET | Freq: Every day | ORAL | Status: DC
  Administered 2014-10-28 – 2014-10-29 (×2): 50 mg via ORAL
  Filled 2014-10-28 (×2): qty 1

## 2014-10-28 MED ORDER — HEPARIN (PORCINE) IN NACL 100-0.45 UNIT/ML-% IJ SOLN
INTRAMUSCULAR | Status: AC
  Administered 2014-10-28: 1300 [IU]/h via INTRAVENOUS
  Filled 2014-10-28: qty 250

## 2014-10-28 MED ORDER — REGADENOSON 0.4 MG/5ML IV SOLN
0.4000 mg | Freq: Once | INTRAVENOUS | Status: DC
  Filled 2014-10-28: qty 5

## 2014-10-28 MED ORDER — TECHNETIUM TC 99M SESTAMIBI GENERIC - CARDIOLITE
10.0000 | Freq: Once | INTRAVENOUS | Status: AC | PRN
  Administered 2014-10-28: 10 via INTRAVENOUS

## 2014-10-28 MED ORDER — HEPARIN SODIUM (PORCINE) 5000 UNIT/ML IJ SOLN
5000.0000 [IU] | Freq: Three times a day (TID) | INTRAMUSCULAR | Status: DC
  Administered 2014-10-28: 5000 [IU] via SUBCUTANEOUS
  Filled 2014-10-28 (×2): qty 1

## 2014-10-28 MED ORDER — HEPARIN BOLUS VIA INFUSION
3000.0000 [IU] | Freq: Once | INTRAVENOUS | Status: AC
  Administered 2014-10-28: 3000 [IU] via INTRAVENOUS
  Filled 2014-10-28: qty 3000

## 2014-10-28 MED ORDER — DICLOFENAC SODIUM 1 % TD GEL
4.0000 g | Freq: Four times a day (QID) | TRANSDERMAL | Status: DC | PRN
  Administered 2014-10-28: 4 g via TOPICAL
  Filled 2014-10-28: qty 100

## 2014-10-28 MED ORDER — TECHNETIUM TC 99M SESTAMIBI GENERIC - CARDIOLITE
30.0000 | Freq: Once | INTRAVENOUS | Status: AC | PRN
  Administered 2014-10-28: 30 via INTRAVENOUS

## 2014-10-28 MED ORDER — ROSUVASTATIN CALCIUM 10 MG PO TABS
10.0000 mg | ORAL_TABLET | Freq: Every day | ORAL | Status: DC
  Administered 2014-10-28: 10 mg via ORAL
  Filled 2014-10-28: qty 1

## 2014-10-28 MED ORDER — TIZANIDINE HCL 2 MG PO TABS
2.0000 mg | ORAL_TABLET | Freq: Three times a day (TID) | ORAL | Status: DC | PRN
Start: 2014-10-28 — End: 2014-10-29
  Administered 2014-10-28 – 2014-10-29 (×2): 2 mg via ORAL
  Filled 2014-10-28 (×3): qty 1

## 2014-10-28 MED ORDER — BUDESONIDE-FORMOTEROL FUMARATE 80-4.5 MCG/ACT IN AERO
2.0000 | INHALATION_SPRAY | Freq: Two times a day (BID) | RESPIRATORY_TRACT | Status: DC
  Administered 2014-10-28 – 2014-10-29 (×2): 2 via RESPIRATORY_TRACT
  Filled 2014-10-28: qty 6.9

## 2014-10-28 NOTE — Progress Notes (Signed)
ANTICOAGULATION CONSULT NOTE - Follow Up Consult  Pharmacy Consult for heparin Indication: chest pain/ACS   Labs:  Recent Labs  10/26/14 1741 10/27/14 1925 10/27/14 2330 10/28/14 0141  HGB 14.4 13.2  --   --   HCT 43.0 39.8  --   --   PLT 234 223  --   --   HEPARINUNFRC  --   --   --  <0.10*  CREATININE 0.59 0.71  --   --   TROPONINI  --  <0.30 <0.30  --     Assessment: 43yo female undetectable on heparin with initial dosing for CP at rest.  Goal of Therapy:  Heparin level 0.3-0.7 units/ml   Plan:  Will rebolus with heparin 3000 units and increase gtt by 4 units/kg/hr to 1300 units/hr and check level in 6hr.  Wynona Neat, PharmD, BCPS  10/28/2014,3:22 AM

## 2014-10-28 NOTE — Progress Notes (Signed)
UR completed 

## 2014-10-28 NOTE — Progress Notes (Signed)
Subjective:  Complains of occasional retrosternal chest pain and neck pain.  Objective:  Vital Signs in the last 24 hours: Temp:  [98.8 F (37.1 C)-98.9 F (37.2 C)] 98.8 F (37.1 C) (12/05 0500) Pulse Rate:  [80-99] 80 (12/05 0500) Resp:  [20] 20 (12/05 0500) BP: (131-157)/(70-88) 157/85 mmHg (12/05 1038) SpO2:  [97 %-99 %] 97 % (12/05 0500) Weight:  [108.909 kg (240 lb 1.6 oz)-109.1 kg (240 lb 8.4 oz)] 108.909 kg (240 lb 1.6 oz) (12/05 0500)  Intake/Output from previous day:   Intake/Output from this shift:    Physical Exam: Neck: no adenopathy, no carotid bruit, no JVD and supple, symmetrical, trachea midline Lungs: clear to auscultation bilaterally Heart: regular rate and rhythm, S1, S2 normal, no murmur, click, rub or gallop Abdomen: soft, non-tender; bowel sounds normal; no masses,  no organomegaly Extremities: extremities normal, atraumatic, no cyanosis or edema  Lab Results:  Recent Labs  10/27/14 1925 10/28/14 0514  WBC 8.4 8.3  HGB 13.2 12.7  PLT 223 203    Recent Labs  10/27/14 1925 10/28/14 0514  NA 142 140  K 3.3* 3.9  CL 103 102  CO2 24 25  GLUCOSE 180* 135*  BUN 8 10  CREATININE 0.71 0.70    Recent Labs  10/27/14 2330 10/28/14 0514  TROPONINI <0.30 <0.30   Hepatic Function Panel  Recent Labs  10/27/14 1925  PROT 6.6  ALBUMIN 3.8  AST 17  ALT 18  ALKPHOS 79  BILITOT 0.3    Recent Labs  10/28/14 0514  CHOL 159   No results for input(s): PROTIME in the last 72 hours.  Imaging: Imaging results have been reviewed and Dg Chest 2 View  10/26/2014   CLINICAL DATA:  Hypertension, shortness of breath  EXAM: CHEST  2 VIEW  COMPARISON:  02/08/2013  FINDINGS: There is elevation of the right diaphragm. There is chronic blunting of the right costophrenic angle. There is no focal parenchymal opacity, pleural effusion, or pneumothorax. The heart and mediastinal contours are unremarkable.  There is an old right upper rib fracture. There is  posttraumatic deformity of the right distal clavicle with chronic AC separation.  IMPRESSION: No active cardiopulmonary disease.   Electronically Signed   By: Kathreen Devoid   On: 10/26/2014 19:41    Cardiac Studies:  Assessment/Plan:  Recurrent chest pain MI ruled out Hypertension Fibromyalgia Morbid obesity Tobacco abuse History of marijuana abuse Plan Continue present management Scheduled for nuclear stress test today  LOS: 1 day    Nayely Dingus N 10/28/2014, 12:07 PM

## 2014-10-28 NOTE — Progress Notes (Signed)
Pt is asking if she can continue her ProAir and Symbicort ordered and use while in the hospital. She is also requesting to have Crestor instead of lipitor for Cholesterol control. Thanks

## 2014-10-28 NOTE — Progress Notes (Signed)
ANTICOAGULATION CONSULT NOTE - Follow Up Consult  Pharmacy Consult for Heparin Indication: chest pain/ACS  Allergies  Allergen Reactions  . Bee Venom Anaphylaxis  . Eggs Or Egg-Derived Products Anaphylaxis, Swelling and Rash  . Nucynta [Tapentadol] Anaphylaxis, Nausea And Vomiting and Swelling  . Mushroom Extract Complex Nausea And Vomiting    Acid reflux  . Aspirin Swelling and Rash  . Nsaids Swelling and Rash    Patient Measurements: Height: 5\' 2"  (157.5 cm) Weight: 240 lb 1.6 oz (108.909 kg) IBW/kg (Calculated) : 50.1 Heparin Dosing Weight: 76.5kg  Vital Signs: Temp: 98.8 F (37.1 C) (12/05 0500) Temp Source: Oral (12/05 0500) BP: 157/85 mmHg (12/05 1038) Pulse Rate: 80 (12/05 0500)  Labs:  Recent Labs  10/26/14 1741 10/27/14 1925 10/27/14 2330 10/28/14 0141 10/28/14 0514 10/28/14 1303  HGB 14.4 13.2  --   --  12.7  --   HCT 43.0 39.8  --   --  39.2  --   PLT 234 223  --   --  203  --   LABPROT  --   --   --   --  13.5  --   INR  --   --   --   --  1.02  --   HEPARINUNFRC  --   --   --  <0.10*  --  0.17*  CREATININE 0.59 0.71  --   --  0.70  --   TROPONINI  --  <0.30 <0.30  --  <0.30  --     Estimated Creatinine Clearance: 103.2 mL/min (by C-G formula based on Cr of 0.7).   Medications:  Heparin 1300 units/hr  Assessment: 43yof continues on heparin for CP/ACS. Heparin level (0.17) is subtherapeutic - will increase heparin rate and check follow-up level.  - H/H and Plts wnl - No significant bleeding reported  Goal of Therapy:  Heparin level 0.3-0.7 units/ml Monitor platelets by anticoagulation protocol: Yes   Plan:  1. Increase heparin drip to 1600 units/hr (16 ml/hr) 2. Check heparin level 6 hours after rate increase 3. Daily heparin level and CBC 4. Follow-up stress test results  [place], [place] 10/28/2014,2:13 PM

## 2014-10-29 DIAGNOSIS — R079 Chest pain, unspecified: Secondary | ICD-10-CM | POA: Diagnosis not present

## 2014-10-29 DIAGNOSIS — I10 Essential (primary) hypertension: Secondary | ICD-10-CM | POA: Diagnosis not present

## 2014-10-29 DIAGNOSIS — E780 Pure hypercholesterolemia: Secondary | ICD-10-CM | POA: Diagnosis not present

## 2014-10-29 DIAGNOSIS — M797 Fibromyalgia: Secondary | ICD-10-CM | POA: Diagnosis not present

## 2014-10-29 LAB — CBC
HCT: 39.1 % (ref 36.0–46.0)
Hemoglobin: 13.1 g/dL (ref 12.0–15.0)
MCH: 30.5 pg (ref 26.0–34.0)
MCHC: 33.5 g/dL (ref 30.0–36.0)
MCV: 90.9 fL (ref 78.0–100.0)
Platelets: 202 10*3/uL (ref 150–400)
RBC: 4.3 MIL/uL (ref 3.87–5.11)
RDW: 13.2 % (ref 11.5–15.5)
WBC: 6.7 10*3/uL (ref 4.0–10.5)

## 2014-10-29 MED ORDER — CARVEDILOL 3.125 MG PO TABS: 3.1250 mg | ORAL_TABLET | Freq: Two times a day (BID) | ORAL | Status: DC

## 2014-10-29 MED ORDER — ROSUVASTATIN CALCIUM 10 MG PO TABS: 10.0000 mg | ORAL_TABLET | Freq: Every day | ORAL | Status: DC

## 2014-10-29 MED ORDER — NICOTINE 14 MG/24HR TD PT24: 14.0000 mg | MEDICATED_PATCH | Freq: Every day | TRANSDERMAL | Status: DC

## 2014-10-29 MED ORDER — LOSARTAN POTASSIUM 50 MG PO TABS: 50.0000 mg | ORAL_TABLET | Freq: Every day | ORAL | Status: DC

## 2014-10-29 NOTE — Discharge Summary (Signed)
Discharge summary dictated on 10/29/2014 dictation number is [id number]

## 2014-10-29 NOTE — Progress Notes (Signed)
  Echocardiogram 2D Echocardiogram has been performed.  [name] [name] 10/29/2014, 8:36 AM

## 2014-10-29 NOTE — Plan of Care (Signed)
Problem: Consults Goal: Tobacco Cessation referral if indicated Outcome: Completed/Met Date Met:  10/29/14

## 2014-10-29 NOTE — Plan of Care (Signed)
Problem: Consults Goal: Chest Pain Patient Education (See Patient Education module for education specifics.) Outcome: Completed/Met Date Met:  10/29/14 Goal: Skin Care Protocol Initiated - if Braden Score 18 or less If consults are not indicated, leave blank or document N/A Outcome: Not Applicable Date Met:  27/03/50 Goal: Nutrition Consult-if indicated Outcome: Not Applicable Date Met:  09/38/18 Goal: Diabetes Guidelines if Diabetic/Glucose > 140 If diabetic or lab glucose is > 140 mg/dl - Initiate Diabetes/Hyperglycemia Guidelines & Document Interventions  Outcome: Not Applicable Date Met:  29/93/71

## 2014-10-29 NOTE — Discharge Instructions (Signed)

## 2014-10-29 NOTE — Discharge Summary (Signed)
NAMEALLAINA, BROTZMAN             ACCOUNT NO.:  [id number]  MEDICAL RECORD NO.:  [id number]  LOCATION:  [id number]                        FACILITY:  [facility]  PHYSICIAN:  Allegra Lai. Terrence Dupont, M.D. DATE OF BIRTH:  Dec 05, 1970  DATE OF ADMISSION:  10/27/2014 DATE OF DISCHARGE:  10/29/2014                              DISCHARGE SUMMARY   ADMITTING DIAGNOSES: 1. Recurrent chest pain, rule out myocardial infarction. 2. Fibromyalgia. 3. Hypertension. 4. Obesity. 5. Tobacco abuse. 6. Marijuana abuse. 7. Chronic pain syndrome. 8. Degenerative joint disease.  FINAL DIAGNOSES: 1. Status post atypical chest pain, myocardial infarction ruled out.     Negative nuclear stress test. 2. Asymptomatic left ventricular dysfunction with EF of 44% by     [facility], with no evidence of reversible ischemia. 3. Hypertension. 4. Hypercholesteremia. 5. Morbid obesity. 6. Tobacco abuse. 7. Marijuana abuse. 8. Chronic pain syndrome. 9. Degenerative joint disease.  MEDICATIONS:  Discharge home medications are; 1. Carvedilol 3.125 mg one tablet twice daily. 2. Losartan 50 mg one tablet daily. 3. NicoDerm CQ 14 mg per 24 hour one patch daily. 4. Crestor 10 mg daily.  Continue rest of the home medications i.e.; 1. Albuterol inhaler 2 puffs every 6 hours as needed. 2. Xanax 1.5 mg at bedtime as before. 3. Valium 5 mg daily. 4. Voltaren 1% gel apply locally 4 times daily as before. 5. Bactroban ointment 3 times daily. 6. Mycostatin suspension 500,000 units four times daily. 7. Oxycodone 20 mg every 6 hours as before, which is being weaned off. 8. Pyridium 100 mg four times daily as needed. 9. Phenergan 25 mg every six hours as needed. 10.Zantac 300 mg at night. 11.Imitrex 1 spray twice daily as needed for migraine. 12.Zanaflex 2 mg three times daily as needed.  DIET:  Low-salt, low-cholesterol, weight-reducing diet.  Patient has been advised regarding lifestyle changes, diet, exercise, smoking,  and marijuana cessation.  FOLLOWUP:  Follow up with Dr. Doylene Canard in 1 week.  Patient will be referred to pain clinic and [facility] as outpatient.  CONDITION AT DISCHARGE:  Stable.  BRIEF HISTORY AND HOSPITAL COURSE:  Patient is a 43 year old female with past medical history significant for multiple medical problems, i.e., hypertension, hypercholesteremia, fibromyalgia, asthma, history of migraine and headaches, GERD, chronic back pain; was admitted by Dr. Doylene Canard on December 4 because of recurrent substernal chest pain without associated symptoms.  Chest pain resolved with sublingual nitro and pain medication.  Denies any fever or chills.  Denies any history of exertional chest pain.  PHYSICAL EXAMINATION:  VITAL SIGNS:  Her blood pressure was 132/82, pulse 99.  She was afebrile. HEENT:  Conjunctivae pink. NECK:  Supple.  No JVD.  No bruit. LUNGS:  Clear to auscultation without rhonchi or rales. CARDIOVASCULAR:  S1, S2 were normal.  There was no S3 or S4 gallop. ABDOMEN:  Soft.  Bowel sounds were present.  Obese, nontender. EXTREMITIES:  There is no clubbing, cyanosis, or edema. NEUROLOGIC:  Grossly intact.  LABORATORY DATA:  Sodium was 137, potassium 3.8.  BUN 6, creatinine 0.59.  Blood sugar was 141.  Three sets of troponin-I were normal. Cholesterol was 159, triglycerides 122, HDL 38, LDL 97.  Hemoglobin  14.4, hematocrit 43, white count of 6.8.  Her hemoglobin A1c was 6.2. TSH was normal at 2.95.  Lexiscan Myoview showed no reversible ischemia or infarct.  LV ejection fraction was 44%.  BRIEF HOSPITAL COURSE:  Patient was admitted to telemetry unit.  MI was ruled out by serial enzymes and EKG.  Patient subsequently underwent Lexiscan Myoview, which showed no evidence of reversible ischemia with EF of 44%.  Patient did not had any further episodes of chest pain during the hospital stay.  Her calcium channel blockers have been stopped in view of  depressed LV systolic function.  Patient was started on ARB and carvedilol, which she is tolerating well.  Patient will be discharged home on above medications and will be followed up by Dr. Doylene Canard early next week.     Allegra Lai. Terrence Dupont, M.D.     MNH/MEDQ  D:  10/29/2014  T:  10/29/2014  Job:  [id number]

## 2014-10-29 NOTE — Progress Notes (Signed)
Utilization Review Completed.   Welda Azzarello, RN, BSN Nurse Case Manager  

## 2014-11-02 ENCOUNTER — Other Ambulatory Visit: Payer: Self-pay | Admitting: Neurology

## 2014-11-02 MED ORDER — PREGABALIN 50 MG PO CAPS: ORAL_CAPSULE | ORAL | Status: DC

## 2014-11-02 NOTE — Telephone Encounter (Signed)
Pt is calling requesting a Rx for Lyrica, states that it may need to be overrided by medicaid.  Please call and advise.

## 2014-11-03 NOTE — Telephone Encounter (Signed)
Rx signed and faxed.

## 2014-11-06 ENCOUNTER — Telehealth: Payer: Self-pay | Admitting: Neurology

## 2014-11-06 NOTE — Telephone Encounter (Signed)
Patient questioning if she should start taking Rx pregabalin (LYRICA) 50 MG capsule tomorrow or take 2 tablets today and start with 1 tablet in am and two in pm on tomorrow.  Please call and advise.

## 2014-11-07 NOTE — Telephone Encounter (Signed)
I called the patient. The patient was given Lyrica back in January 2015. She was given a 50 mg capsule, told to go on one twice a day for a week then take 1 in the morning and 2 in the evening.

## 2014-11-07 NOTE — Telephone Encounter (Signed)
Spoke to patient to get clarification about Lyrica. Patient hasn't been seen since January 2015.  She was recently in hospital and was given the lyrica, and had not been approved for it last year.  When she went to pick up another Rx at her pharmacy and they also gave her the Lyrica.   She would like to know how to take medicine.  It's ok to leave a message.

## 2014-11-08 ENCOUNTER — Telehealth: Payer: Self-pay

## 2014-11-08 NOTE — Telephone Encounter (Signed)
Patient called requesting U/S and blood work results. Called patient and informed her of her results. Patient reports she needed to reschedule her appointment to January. Informed her at that visit her options to deal with bleeding will be discussed. Patient verbalized understanding. No further questions or concerns.

## 2014-11-09 ENCOUNTER — Ambulatory Visit: Payer: Medicaid Other | Admitting: Family Medicine

## 2014-12-01 ENCOUNTER — Ambulatory Visit: Payer: Medicaid Other | Admitting: Family Medicine

## 2014-12-09 ENCOUNTER — Other Ambulatory Visit: Payer: Self-pay | Admitting: Neurology

## 2014-12-19 ENCOUNTER — Telehealth: Payer: Self-pay | Admitting: Neurology

## 2014-12-19 NOTE — Telephone Encounter (Signed)
Left message for patient to call for a sooner appointment than April, she is on wait list.

## 2015-02-14 ENCOUNTER — Telehealth: Payer: Self-pay

## 2015-02-14 MED ORDER — ZOLMITRIPTAN 5 MG NA SOLN: 1.0000 | Freq: Two times a day (BID) | NASAL | Status: DC | PRN

## 2015-02-14 NOTE — Telephone Encounter (Signed)
I spoke with pharmacist at [facility].  They said Sumatriptan Nasal Spray is on long term back order, and not available from the manufacturer.  They are unsure when it will become available again, but feel it may be greater than 3 months.  Would you like to prescribe an alternate medication?  Please advise.  Thank you.  (Patient has follow up appt scheduled on 04/15)

## 2015-02-14 NOTE — Telephone Encounter (Signed)
Unable to get Imitrex nasal spray, I will call in Zomig nasal spray. The patient has not been seen in our office in greater than 1 year, she does have a revisit set up for April 2016.

## 2015-03-09 ENCOUNTER — Telehealth: Payer: Self-pay

## 2015-03-09 ENCOUNTER — Ambulatory Visit: Payer: Medicaid Other | Admitting: Neurology

## 2015-03-09 NOTE — Telephone Encounter (Signed)
Patient did not come to a follow up appointment. 

## 2015-03-29 ENCOUNTER — Encounter: Payer: Self-pay | Admitting: Neurology

## 2015-04-04 ENCOUNTER — Emergency Department (HOSPITAL_COMMUNITY)
Admission: EM | Admit: 2015-04-04 | Discharge: 2015-04-04 | Disposition: A | Discharge status: Home / Self Care | Payer: Medicaid Other | Attending: Emergency Medicine | Admitting: Emergency Medicine

## 2015-04-04 ENCOUNTER — Encounter (HOSPITAL_COMMUNITY): Payer: Self-pay | Admitting: *Deleted

## 2015-04-04 DIAGNOSIS — G8929 Other chronic pain: Secondary | ICD-10-CM | POA: Diagnosis not present

## 2015-04-04 DIAGNOSIS — E669 Obesity, unspecified: Secondary | ICD-10-CM | POA: Insufficient documentation

## 2015-04-04 DIAGNOSIS — Y929 Unspecified place or not applicable: Secondary | ICD-10-CM | POA: Diagnosis not present

## 2015-04-04 DIAGNOSIS — S3992XA Unspecified injury of lower back, initial encounter: Secondary | ICD-10-CM | POA: Insufficient documentation

## 2015-04-04 DIAGNOSIS — Y9389 Activity, other specified: Secondary | ICD-10-CM | POA: Insufficient documentation

## 2015-04-04 DIAGNOSIS — F419 Anxiety disorder, unspecified: Secondary | ICD-10-CM | POA: Diagnosis not present

## 2015-04-04 DIAGNOSIS — S79912A Unspecified injury of left hip, initial encounter: Secondary | ICD-10-CM | POA: Insufficient documentation

## 2015-04-04 DIAGNOSIS — Z8701 Personal history of pneumonia (recurrent): Secondary | ICD-10-CM | POA: Diagnosis not present

## 2015-04-04 DIAGNOSIS — Z862 Personal history of diseases of the blood and blood-forming organs and certain disorders involving the immune mechanism: Secondary | ICD-10-CM | POA: Insufficient documentation

## 2015-04-04 DIAGNOSIS — Z720 Tobacco use: Secondary | ICD-10-CM | POA: Diagnosis not present

## 2015-04-04 DIAGNOSIS — M549 Dorsalgia, unspecified: Secondary | ICD-10-CM

## 2015-04-04 DIAGNOSIS — J45909 Unspecified asthma, uncomplicated: Secondary | ICD-10-CM | POA: Diagnosis not present

## 2015-04-04 DIAGNOSIS — I209 Angina pectoris, unspecified: Secondary | ICD-10-CM | POA: Insufficient documentation

## 2015-04-04 DIAGNOSIS — Y999 Unspecified external cause status: Secondary | ICD-10-CM | POA: Insufficient documentation

## 2015-04-04 DIAGNOSIS — Z792 Long term (current) use of antibiotics: Secondary | ICD-10-CM | POA: Diagnosis not present

## 2015-04-04 DIAGNOSIS — Z8709 Personal history of other diseases of the respiratory system: Secondary | ICD-10-CM | POA: Diagnosis not present

## 2015-04-04 DIAGNOSIS — Z79899 Other long term (current) drug therapy: Secondary | ICD-10-CM | POA: Diagnosis not present

## 2015-04-04 DIAGNOSIS — I10 Essential (primary) hypertension: Secondary | ICD-10-CM | POA: Insufficient documentation

## 2015-04-04 DIAGNOSIS — E780 Pure hypercholesterolemia: Secondary | ICD-10-CM | POA: Diagnosis not present

## 2015-04-04 DIAGNOSIS — M1990 Unspecified osteoarthritis, unspecified site: Secondary | ICD-10-CM | POA: Insufficient documentation

## 2015-04-04 DIAGNOSIS — Z87440 Personal history of urinary (tract) infections: Secondary | ICD-10-CM | POA: Diagnosis not present

## 2015-04-04 DIAGNOSIS — W1839XA Other fall on same level, initial encounter: Secondary | ICD-10-CM | POA: Insufficient documentation

## 2015-04-04 DIAGNOSIS — K219 Gastro-esophageal reflux disease without esophagitis: Secondary | ICD-10-CM | POA: Insufficient documentation

## 2015-04-04 DIAGNOSIS — G43909 Migraine, unspecified, not intractable, without status migrainosus: Secondary | ICD-10-CM | POA: Insufficient documentation

## 2015-04-04 MED ORDER — LIDOCAINE 5 % EX PTCH: 1.0000 | MEDICATED_PATCH | CUTANEOUS | Status: DC

## 2015-04-04 MED ORDER — HYDROMORPHONE HCL 1 MG/ML IJ SOLN
2.0000 mg | Freq: Once | INTRAMUSCULAR | Status: AC
  Administered 2015-04-04: 2 mg via INTRAMUSCULAR
  Filled 2015-04-04: qty 2

## 2015-04-04 MED ORDER — LUMBAR BACK BRACE/SUPPORT PAD MISC: 1.0000 | Freq: Every day | Status: DC

## 2015-04-04 NOTE — ED Notes (Signed)
Pt states that she was laying on the floor last night watching tv. Pt states that she had trouble getting off the floor due to pain.Pt sates that she is here because she wants back surgery or a brace. Pt states that she took her home pain medication. Pt uses walker at home.

## 2015-04-04 NOTE — Discharge Instructions (Signed)
SEEK IMMEDIATE MEDICAL ATTENTION IF: New numbness, tingling, weakness, or problem with the use of your arms or legs.  Severe back pain not relieved with medications.  Change in bowel or bladder control.  Increasing pain in any areas of the body (such as chest or abdominal pain).  Shortness of breath, dizziness or fainting.  Nausea (feeling sick to your stomach), vomiting, fever, or sweats.  Back Pain, Adult Low back pain is very common. About 1 in 5 people have back pain.The cause of low back pain is rarely dangerous. The pain often gets better over time.About half of people with a sudden onset of back pain feel better in just 2 weeks. About 8 in 10 people feel better by 6 weeks.  CAUSES Some common causes of back pain include:  Strain of the muscles or ligaments supporting the spine.  Wear and tear (degeneration) of the spinal discs.  Arthritis.  Direct injury to the back. DIAGNOSIS Most of the time, the direct cause of low back pain is not known.However, back pain can be treated effectively even when the exact cause of the pain is unknown.Answering your caregiver's questions about your overall health and symptoms is one of the most accurate ways to make sure the cause of your pain is not dangerous. If your caregiver needs more information, he or she may order lab work or imaging tests (X-rays or MRIs).However, even if imaging tests show changes in your back, this usually does not require surgery. HOME CARE INSTRUCTIONS For many people, back pain returns.Since low back pain is rarely dangerous, it is often a condition that people can learn to [facility] their own.   Remain active. It is stressful on the back to sit or stand in one place. Do not sit, drive, or stand in one place for more than 30 minutes at a time. Take short walks on level surfaces as soon as pain allows.Try to increase the length of time you walk each day.  Do not stay in bed.Resting more than 1 or 2 days can delay  your recovery.  Do not avoid exercise or work.Your body is made to move.It is not dangerous to be active, even though your back may hurt.Your back will likely heal faster if you return to being active before your pain is gone.  Pay attention to your body when you bend and lift. Many people have less discomfortwhen lifting if they bend their knees, keep the load close to their bodies,and avoid twisting. Often, the most comfortable positions are those that put less stress on your recovering back.  Find a comfortable position to sleep. Use a firm mattress and lie on your side with your knees slightly bent. If you lie on your back, put a pillow under your knees.  Only take over-the-counter or prescription medicines as directed by your caregiver. Over-the-counter medicines to reduce pain and inflammation are often the most helpful.Your caregiver may prescribe muscle relaxant drugs.These medicines help dull your pain so you can more quickly return to your normal activities and healthy exercise.  Put ice on the injured area.  Put ice in a plastic bag.  Place a towel between your skin and the bag.  Leave the ice on for 15-20 minutes, 03-04 times a day for the first 2 to 3 days. After that, ice and heat may be alternated to reduce pain and spasms.  Ask your caregiver about trying back exercises and gentle massage. This may be of some benefit.  Avoid feeling anxious or stressed.Stress  increases muscle tension and can worsen back pain.It is important to recognize when you are anxious or stressed and learn ways to manage it.Exercise is a great option. SEEK MEDICAL CARE IF:  You have pain that is not relieved with rest or medicine.  You have pain that does not improve in 1 week.  You have new symptoms.  You are generally not feeling well. SEEK IMMEDIATE MEDICAL CARE IF:   You have pain that radiates from your back into your legs.  You develop new bowel or bladder control  problems.  You have unusual weakness or numbness in your arms or legs.  You develop nausea or vomiting.  You develop abdominal pain.  You feel faint. Document Released: 11/10/2005 Document Revised: 05/11/2012 Document Reviewed: 03/14/2014 [facility] Patient Information 2015 [place], [place]. This information is not intended to replace advice given to you by your health care provider. Make sure you discuss any questions you have with your health care provider.

## 2015-04-04 NOTE — ED Notes (Signed)
Pt ambulatory with walker at triage
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
## 2015-04-05 ENCOUNTER — Emergency Department (HOSPITAL_COMMUNITY)
Admission: EM | Admit: 2015-04-05 | Discharge: 2015-04-05 | Disposition: A | Discharge status: Home / Self Care | Payer: Medicaid Other | Attending: Emergency Medicine | Admitting: Emergency Medicine

## 2015-04-05 ENCOUNTER — Encounter (HOSPITAL_COMMUNITY): Payer: Self-pay | Admitting: *Deleted

## 2015-04-05 DIAGNOSIS — G8929 Other chronic pain: Secondary | ICD-10-CM | POA: Diagnosis not present

## 2015-04-05 DIAGNOSIS — R51 Headache: Secondary | ICD-10-CM | POA: Diagnosis not present

## 2015-04-05 DIAGNOSIS — Z8701 Personal history of pneumonia (recurrent): Secondary | ICD-10-CM | POA: Insufficient documentation

## 2015-04-05 DIAGNOSIS — I209 Angina pectoris, unspecified: Secondary | ICD-10-CM | POA: Insufficient documentation

## 2015-04-05 DIAGNOSIS — J45909 Unspecified asthma, uncomplicated: Secondary | ICD-10-CM | POA: Insufficient documentation

## 2015-04-05 DIAGNOSIS — M545 Low back pain: Secondary | ICD-10-CM | POA: Diagnosis not present

## 2015-04-05 DIAGNOSIS — Z862 Personal history of diseases of the blood and blood-forming organs and certain disorders involving the immune mechanism: Secondary | ICD-10-CM | POA: Insufficient documentation

## 2015-04-05 DIAGNOSIS — M546 Pain in thoracic spine: Secondary | ICD-10-CM | POA: Diagnosis not present

## 2015-04-05 DIAGNOSIS — Z79899 Other long term (current) drug therapy: Secondary | ICD-10-CM | POA: Insufficient documentation

## 2015-04-05 DIAGNOSIS — E780 Pure hypercholesterolemia: Secondary | ICD-10-CM | POA: Insufficient documentation

## 2015-04-05 DIAGNOSIS — Z720 Tobacco use: Secondary | ICD-10-CM | POA: Diagnosis not present

## 2015-04-05 DIAGNOSIS — K219 Gastro-esophageal reflux disease without esophagitis: Secondary | ICD-10-CM | POA: Insufficient documentation

## 2015-04-05 DIAGNOSIS — I10 Essential (primary) hypertension: Secondary | ICD-10-CM | POA: Diagnosis not present

## 2015-04-05 DIAGNOSIS — M549 Dorsalgia, unspecified: Secondary | ICD-10-CM

## 2015-04-05 DIAGNOSIS — Z87440 Personal history of urinary (tract) infections: Secondary | ICD-10-CM | POA: Insufficient documentation

## 2015-04-05 DIAGNOSIS — M542 Cervicalgia: Secondary | ICD-10-CM | POA: Diagnosis not present

## 2015-04-05 DIAGNOSIS — F419 Anxiety disorder, unspecified: Secondary | ICD-10-CM | POA: Diagnosis not present

## 2015-04-05 DIAGNOSIS — E669 Obesity, unspecified: Secondary | ICD-10-CM | POA: Diagnosis not present

## 2015-04-05 MED ORDER — HYDROMORPHONE HCL 1 MG/ML IJ SOLN
2.0000 mg | Freq: Once | INTRAMUSCULAR | Status: AC
  Administered 2015-04-05: 2 mg via INTRAMUSCULAR
  Filled 2015-04-05: qty 2

## 2015-04-05 NOTE — Discharge Instructions (Signed)
Chronic Pain  Chronic pain can be defined as pain that is off and on and lasts for 3-6 months or longer. Many things cause chronic pain, which can make it difficult to make a diagnosis. There are many treatment options available for chronic pain. However, finding a treatment that works well for you may require trying various approaches until the right one is found. Many people benefit from a combination of two or more types of treatment to control their pain.  SYMPTOMS   Chronic pain can occur anywhere in the body and can range from mild to very severe. Some types of chronic pain include:  · Headache.  · Low back pain.  · Cancer pain.  · Arthritis pain.  · Neurogenic pain. This is pain resulting from damage to nerves.   People with chronic pain may also have other symptoms such as:  · Depression.  · Anger.  · Insomnia.  · Anxiety.  DIAGNOSIS   Your health care provider will help diagnose your condition over time. In many cases, the initial focus will be on excluding possible conditions that could be causing the pain. Depending on your symptoms, your health care provider may order tests to diagnose your condition. Some of these tests may include:   · Blood tests.    · CT scan.    · MRI.    · X-rays.    · Ultrasounds.    · Nerve conduction studies.    You may need to see a specialist.   TREATMENT   Finding treatment that works well may take time. You may be referred to a pain specialist. He or she may prescribe medicine or therapies, such as:   · Mindful meditation or yoga.  · Shots (injections) of numbing or pain-relieving medicines into the spine or area of pain.  · Local electrical stimulation.  · Acupuncture.    · Massage therapy.    · Aroma, color, light, or sound therapy.    · Biofeedback.    · Working with a physical therapist to keep from getting stiff.    · Regular, gentle exercise.    · Cognitive or behavioral therapy.    · Group support.    Sometimes, surgery may be recommended.   HOME CARE INSTRUCTIONS    · Take all medicines as directed by your health care provider.    · Lessen stress in your life by relaxing and doing things such as listening to calming music.    · Exercise or be active as directed by your health care provider.    · Eat a healthy diet and include things such as vegetables, fruits, fish, and lean meats in your diet.    · Keep all follow-up appointments with your health care provider.    · Attend a support group with others suffering from chronic pain.  SEEK MEDICAL CARE IF:   · Your pain gets worse.    · You develop a new pain that was not there before.    · You cannot tolerate medicines given to you by your health care provider.    · You have new symptoms since your last visit with your health care provider.    SEEK IMMEDIATE MEDICAL CARE IF:   · You feel weak.    · You have decreased sensation or numbness.    · You lose control of bowel or bladder function.    · Your pain suddenly gets much worse.    · You develop shaking.  · You develop chills.  · You develop confusion.  · You develop chest pain.  · You develop shortness of breath.    MAKE SURE YOU:  ·   Understand these instructions.  · Will watch your condition.  · Will get help right away if you are not doing well or get worse.  Document Released: 08/02/2002 Document Revised: 07/13/2013 Document Reviewed: 05/06/2013  [employer]® Patient Information ©2015 [employer]. This information is not intended to replace advice given to you by your health care provider. Make sure you discuss any questions you have with your health care provider.  Chronic Back Pain   When back pain lasts longer than 3 months, it is called chronic back pain. People with chronic back pain often go through certain periods that are more intense (flare-ups).   CAUSES  Chronic back pain can be caused by wear and tear (degeneration) on different structures in your back. These structures include:  · The bones of your spine (vertebrae) and the joints surrounding your spinal cord  and nerve roots (facets).  · The strong, fibrous tissues that connect your vertebrae (ligaments).  Degeneration of these structures may result in pressure on your nerves. This can lead to constant pain.  HOME CARE INSTRUCTIONS  · Avoid bending, heavy lifting, prolonged sitting, and activities which make the problem worse.  · Take brief periods of rest throughout the day to reduce your pain. Lying down or standing usually is better than sitting while you are resting.  · Take over-the-counter or prescription medicines only as directed by your caregiver.  SEEK IMMEDIATE MEDICAL CARE IF:   · You have weakness or numbness in one of your legs or feet.  · You have trouble controlling your bladder or bowels.  · You have nausea, vomiting, abdominal pain, shortness of breath, or fainting.  Document Released: 12/18/2004 Document Revised: 02/02/2012 Document Reviewed: 10/25/2011  [employer]® Patient Information ©2015 [employer]. This information is not intended to replace advice given to you by your health care provider. Make sure you discuss any questions you have with your health care provider.
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
## 2015-04-05 NOTE — ED Notes (Signed)
PA at bedside.

## 2015-04-05 NOTE — ED Notes (Signed)
Pt c/o lower back pain that radiates to her neck. Pt states she was seen here for the exact same complaint. Pt states she was contacted by someone at [facility] and instructed to come back to the ED for follow up care.

## 2015-04-06 ENCOUNTER — Other Ambulatory Visit: Payer: Self-pay | Admitting: Cardiovascular Disease

## 2015-04-06 ENCOUNTER — Ambulatory Visit
Admission: RE | Admit: 2015-04-06 | Discharge: 2015-04-06 | Disposition: A | Discharge status: Home / Self Care | Payer: Medicaid Other | Source: Ambulatory Visit | Attending: Cardiovascular Disease | Admitting: Cardiovascular Disease

## 2015-04-06 ENCOUNTER — Ambulatory Visit: Payer: Medicaid Other

## 2015-04-06 DIAGNOSIS — M5489 Other dorsalgia: Secondary | ICD-10-CM

## 2015-05-04 ENCOUNTER — Encounter (HOSPITAL_COMMUNITY): Payer: Self-pay | Admitting: Family Medicine

## 2015-05-04 ENCOUNTER — Emergency Department (HOSPITAL_COMMUNITY)
Admission: EM | Admit: 2015-05-04 | Discharge: 2015-05-04 | Disposition: A | Discharge status: Home / Self Care | Payer: Medicaid Other | Attending: Emergency Medicine | Admitting: Emergency Medicine

## 2015-05-04 ENCOUNTER — Emergency Department (HOSPITAL_COMMUNITY): Payer: Medicaid Other

## 2015-05-04 DIAGNOSIS — M1990 Unspecified osteoarthritis, unspecified site: Secondary | ICD-10-CM | POA: Diagnosis not present

## 2015-05-04 DIAGNOSIS — I209 Angina pectoris, unspecified: Secondary | ICD-10-CM | POA: Insufficient documentation

## 2015-05-04 DIAGNOSIS — F419 Anxiety disorder, unspecified: Secondary | ICD-10-CM | POA: Diagnosis not present

## 2015-05-04 DIAGNOSIS — M549 Dorsalgia, unspecified: Secondary | ICD-10-CM | POA: Diagnosis not present

## 2015-05-04 DIAGNOSIS — Z791 Long term (current) use of non-steroidal anti-inflammatories (NSAID): Secondary | ICD-10-CM | POA: Insufficient documentation

## 2015-05-04 DIAGNOSIS — G43909 Migraine, unspecified, not intractable, without status migrainosus: Secondary | ICD-10-CM | POA: Insufficient documentation

## 2015-05-04 DIAGNOSIS — Z7951 Long term (current) use of inhaled steroids: Secondary | ICD-10-CM | POA: Diagnosis not present

## 2015-05-04 DIAGNOSIS — Z8701 Personal history of pneumonia (recurrent): Secondary | ICD-10-CM | POA: Diagnosis not present

## 2015-05-04 DIAGNOSIS — Z79899 Other long term (current) drug therapy: Secondary | ICD-10-CM | POA: Insufficient documentation

## 2015-05-04 DIAGNOSIS — E669 Obesity, unspecified: Secondary | ICD-10-CM | POA: Insufficient documentation

## 2015-05-04 DIAGNOSIS — G8929 Other chronic pain: Secondary | ICD-10-CM | POA: Insufficient documentation

## 2015-05-04 DIAGNOSIS — I10 Essential (primary) hypertension: Secondary | ICD-10-CM | POA: Insufficient documentation

## 2015-05-04 DIAGNOSIS — R079 Chest pain, unspecified: Secondary | ICD-10-CM | POA: Diagnosis present

## 2015-05-04 DIAGNOSIS — K219 Gastro-esophageal reflux disease without esophagitis: Secondary | ICD-10-CM | POA: Insufficient documentation

## 2015-05-04 DIAGNOSIS — Z862 Personal history of diseases of the blood and blood-forming organs and certain disorders involving the immune mechanism: Secondary | ICD-10-CM | POA: Insufficient documentation

## 2015-05-04 DIAGNOSIS — Z87440 Personal history of urinary (tract) infections: Secondary | ICD-10-CM | POA: Insufficient documentation

## 2015-05-04 DIAGNOSIS — J45909 Unspecified asthma, uncomplicated: Secondary | ICD-10-CM | POA: Insufficient documentation

## 2015-05-04 DIAGNOSIS — Z720 Tobacco use: Secondary | ICD-10-CM | POA: Diagnosis not present

## 2015-05-04 DIAGNOSIS — E780 Pure hypercholesterolemia: Secondary | ICD-10-CM | POA: Diagnosis not present

## 2015-05-04 LAB — CBC
HCT: 42.2 % (ref 36.0–46.0)
Hemoglobin: 13.9 g/dL (ref 12.0–15.0)
MCH: 28.7 pg (ref 26.0–34.0)
MCHC: 32.9 g/dL (ref 30.0–36.0)
MCV: 87.2 fL (ref 78.0–100.0)
Platelets: 222 10*3/uL (ref 150–400)
RBC: 4.84 MIL/uL (ref 3.87–5.11)
RDW: 14.2 % (ref 11.5–15.5)
WBC: 6.9 10*3/uL (ref 4.0–10.5)

## 2015-05-04 LAB — BASIC METABOLIC PANEL
Anion gap: 8 (ref 5–15)
BUN: 9 mg/dL (ref 6–20)
CO2: 24 mmol/L (ref 22–32)
Calcium: 8.8 mg/dL — ABNORMAL LOW (ref 8.9–10.3)
Chloride: 104 mmol/L (ref 101–111)
Creatinine, Ser: 0.75 mg/dL (ref 0.44–1.00)
GFR calc Af Amer: >60 mL/min (ref >60)
GFR calc non Af Amer: >60 mL/min (ref >60)
Glucose, Bld: 129 mg/dL — ABNORMAL HIGH (ref 65–99)
Potassium: 4.2 mmol/L (ref 3.5–5.1)
Sodium: 136 mmol/L (ref 135–145)

## 2015-05-04 LAB — I-STAT TROPONIN, ED
Troponin i, poc: 0 ng/mL (ref 0.00–0.08)
Troponin i, poc: 0 ng/mL (ref 0.00–0.08)

## 2015-05-04 MED ORDER — MORPHINE SULFATE 4 MG/ML IJ SOLN
4.0000 mg | Freq: Once | INTRAMUSCULAR | Status: AC
  Administered 2015-05-04: 4 mg via INTRAVENOUS
  Filled 2015-05-04: qty 1

## 2015-05-04 NOTE — ED Notes (Signed)
Pt requesting food to eat.  Alert talking on the phone

## 2015-05-04 NOTE — ED Notes (Signed)
Food given up to the br with assistance
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
## 2015-05-04 NOTE — Discharge Instructions (Signed)
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
## 2015-06-07 ENCOUNTER — Other Ambulatory Visit: Payer: Self-pay | Admitting: Cardiovascular Disease

## 2015-06-07 DIAGNOSIS — M545 Low back pain: Secondary | ICD-10-CM

## 2015-06-29 ENCOUNTER — Other Ambulatory Visit: Payer: Medicaid Other

## 2015-07-07 ENCOUNTER — Ambulatory Visit
Admission: RE | Admit: 2015-07-07 | Discharge: 2015-07-07 | Disposition: A | Discharge status: Home / Self Care | Payer: Medicaid Other | Source: Ambulatory Visit | Attending: Cardiovascular Disease | Admitting: Cardiovascular Disease

## 2015-07-07 DIAGNOSIS — M545 Low back pain: Secondary | ICD-10-CM

## 2015-07-30 ENCOUNTER — Encounter (HOSPITAL_COMMUNITY): Payer: Self-pay | Admitting: Vascular Surgery

## 2015-07-30 ENCOUNTER — Emergency Department (HOSPITAL_COMMUNITY)
Admission: EM | Admit: 2015-07-30 | Discharge: 2015-07-30 | Disposition: A | Discharge status: Home / Self Care | Payer: Medicaid Other | Attending: Emergency Medicine | Admitting: Emergency Medicine

## 2015-07-30 ENCOUNTER — Emergency Department (HOSPITAL_COMMUNITY): Payer: Medicaid Other

## 2015-07-30 DIAGNOSIS — M797 Fibromyalgia: Secondary | ICD-10-CM | POA: Insufficient documentation

## 2015-07-30 DIAGNOSIS — Z3202 Encounter for pregnancy test, result negative: Secondary | ICD-10-CM | POA: Diagnosis not present

## 2015-07-30 DIAGNOSIS — Z8701 Personal history of pneumonia (recurrent): Secondary | ICD-10-CM | POA: Diagnosis not present

## 2015-07-30 DIAGNOSIS — Z862 Personal history of diseases of the blood and blood-forming organs and certain disorders involving the immune mechanism: Secondary | ICD-10-CM | POA: Diagnosis not present

## 2015-07-30 DIAGNOSIS — G4733 Obstructive sleep apnea (adult) (pediatric): Secondary | ICD-10-CM | POA: Diagnosis not present

## 2015-07-30 DIAGNOSIS — I10 Essential (primary) hypertension: Secondary | ICD-10-CM | POA: Insufficient documentation

## 2015-07-30 DIAGNOSIS — E669 Obesity, unspecified: Secondary | ICD-10-CM | POA: Diagnosis not present

## 2015-07-30 DIAGNOSIS — Z720 Tobacco use: Secondary | ICD-10-CM | POA: Diagnosis not present

## 2015-07-30 DIAGNOSIS — J45909 Unspecified asthma, uncomplicated: Secondary | ICD-10-CM | POA: Diagnosis not present

## 2015-07-30 DIAGNOSIS — Z87440 Personal history of urinary (tract) infections: Secondary | ICD-10-CM | POA: Diagnosis not present

## 2015-07-30 DIAGNOSIS — Z79899 Other long term (current) drug therapy: Secondary | ICD-10-CM | POA: Insufficient documentation

## 2015-07-30 DIAGNOSIS — G43009 Migraine without aura, not intractable, without status migrainosus: Secondary | ICD-10-CM | POA: Insufficient documentation

## 2015-07-30 DIAGNOSIS — I209 Angina pectoris, unspecified: Secondary | ICD-10-CM | POA: Insufficient documentation

## 2015-07-30 DIAGNOSIS — R109 Unspecified abdominal pain: Secondary | ICD-10-CM | POA: Insufficient documentation

## 2015-07-30 DIAGNOSIS — M545 Low back pain, unspecified: Secondary | ICD-10-CM

## 2015-07-30 DIAGNOSIS — E782 Mixed hyperlipidemia: Secondary | ICD-10-CM | POA: Insufficient documentation

## 2015-07-30 DIAGNOSIS — G8929 Other chronic pain: Secondary | ICD-10-CM | POA: Diagnosis not present

## 2015-07-30 DIAGNOSIS — Z9981 Dependence on supplemental oxygen: Secondary | ICD-10-CM | POA: Insufficient documentation

## 2015-07-30 DIAGNOSIS — F419 Anxiety disorder, unspecified: Secondary | ICD-10-CM | POA: Insufficient documentation

## 2015-07-30 LAB — URINALYSIS, ROUTINE W REFLEX MICROSCOPIC
Bilirubin Urine: NEGATIVE
Glucose, UA: NEGATIVE mg/dL
Ketones, ur: NEGATIVE mg/dL
Leukocytes, UA: NEGATIVE
Nitrite: NEGATIVE
Protein, ur: NEGATIVE mg/dL
Specific Gravity, Urine: 1.015 (ref 1.005–1.030)
Urobilinogen, UA: 0.2 mg/dL (ref 0.0–1.0)
pH: 5 (ref 5.0–8.0)

## 2015-07-30 LAB — POC URINE PREG, ED: Preg Test, Ur: NEGATIVE

## 2015-07-30 LAB — URINE MICROSCOPIC-ADD ON

## 2015-07-30 MED ORDER — KETOROLAC TROMETHAMINE 30 MG/ML IJ SOLN
30.0000 mg | Freq: Once | INTRAMUSCULAR | Status: AC
  Administered 2015-07-30: 30 mg via INTRAMUSCULAR
  Filled 2015-07-30: qty 1

## 2015-07-30 NOTE — ED Notes (Signed)
Declined W/C at D/C and was escorted to lobby by RN. 

## 2015-07-30 NOTE — ED Notes (Signed)
Pt reports to the ED for eval of increased back pain x 4 days. She reports she has hx of back problems but it is different and more severe than her normal back pain. Has tried Voltaren gel, morphine, saboxen, and roxicodone and none of it has helped. Pt denies any numbness, tingling, paralysis, or bowel or bladder changes. Pt denies any urinary symptoms. Pt A&Ox4, resp e/u, and skin warm and dry.

## 2015-07-30 NOTE — Discharge Instructions (Signed)
Back Exercises Keep your follow-up appointment on 08/06/2015 with your primary care physician. Return for fever, increased pain, burning with urination. Back exercises help treat and prevent back injuries. The goal of back exercises is to increase the strength of your abdominal and back muscles and the flexibility of your back. These exercises should be started when you no longer have back pain. Back exercises include:  Pelvic Tilt. Lie on your back with your knees bent. Tilt your pelvis until the lower part of your back is against the floor. Hold this position 5 to 10 sec and repeat 5 to 10 times.  Knee to Chest. Pull first 1 knee up against your chest and hold for 20 to 30 seconds, repeat this with the other knee, and then both knees. This may be done with the other leg straight or bent, whichever feels better.  Sit-Ups or Curl-Ups. Bend your knees 90 degrees. Start with tilting your pelvis, and do a partial, slow sit-up, lifting your trunk only 30 to 45 degrees off the floor. Take at least 2 to 3 seconds for each sit-up. Do not do sit-ups with your knees out straight. If partial sit-ups are difficult, simply do the above but with only tightening your abdominal muscles and holding it as directed.  Hip-Lift. Lie on your back with your knees flexed 90 degrees. Push down with your feet and shoulders as you raise your hips a couple inches off the floor; hold for 10 seconds, repeat 5 to 10 times.  Back arches. Lie on your stomach, propping yourself up on bent elbows. Slowly press on your hands, causing an arch in your low back. Repeat 3 to 5 times. Any initial stiffness and discomfort should lessen with repetition over time.  Shoulder-Lifts. Lie face down with arms beside your body. Keep hips and torso pressed to floor as you slowly lift your head and shoulders off the floor. Do not overdo your exercises, especially in the beginning. Exercises may cause you some mild back discomfort which lasts for a few  minutes; however, if the pain is more severe, or lasts for more than 15 minutes, do not continue exercises until you see your caregiver. Improvement with exercise therapy for back problems is slow.  See your caregivers for assistance with developing a proper back exercise program. Document Released: 12/18/2004 Document Revised: 02/02/2012 Document Reviewed: 09/11/2011 [facility] Patient Information 2015 [place], [place]. This information is not intended to replace advice given to you by your health care provider. Make sure you discuss any questions you have with your health care provider.
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
## 2015-09-13 IMAGING — CR DG CHEST 2V
2 series · 2 of 2 positions shown · non-contrast
Comparison: 10/26/2014

CLINICAL DATA: Left-sided chest pain

EXAM:
CHEST - 2 VIEW

[chest pa]
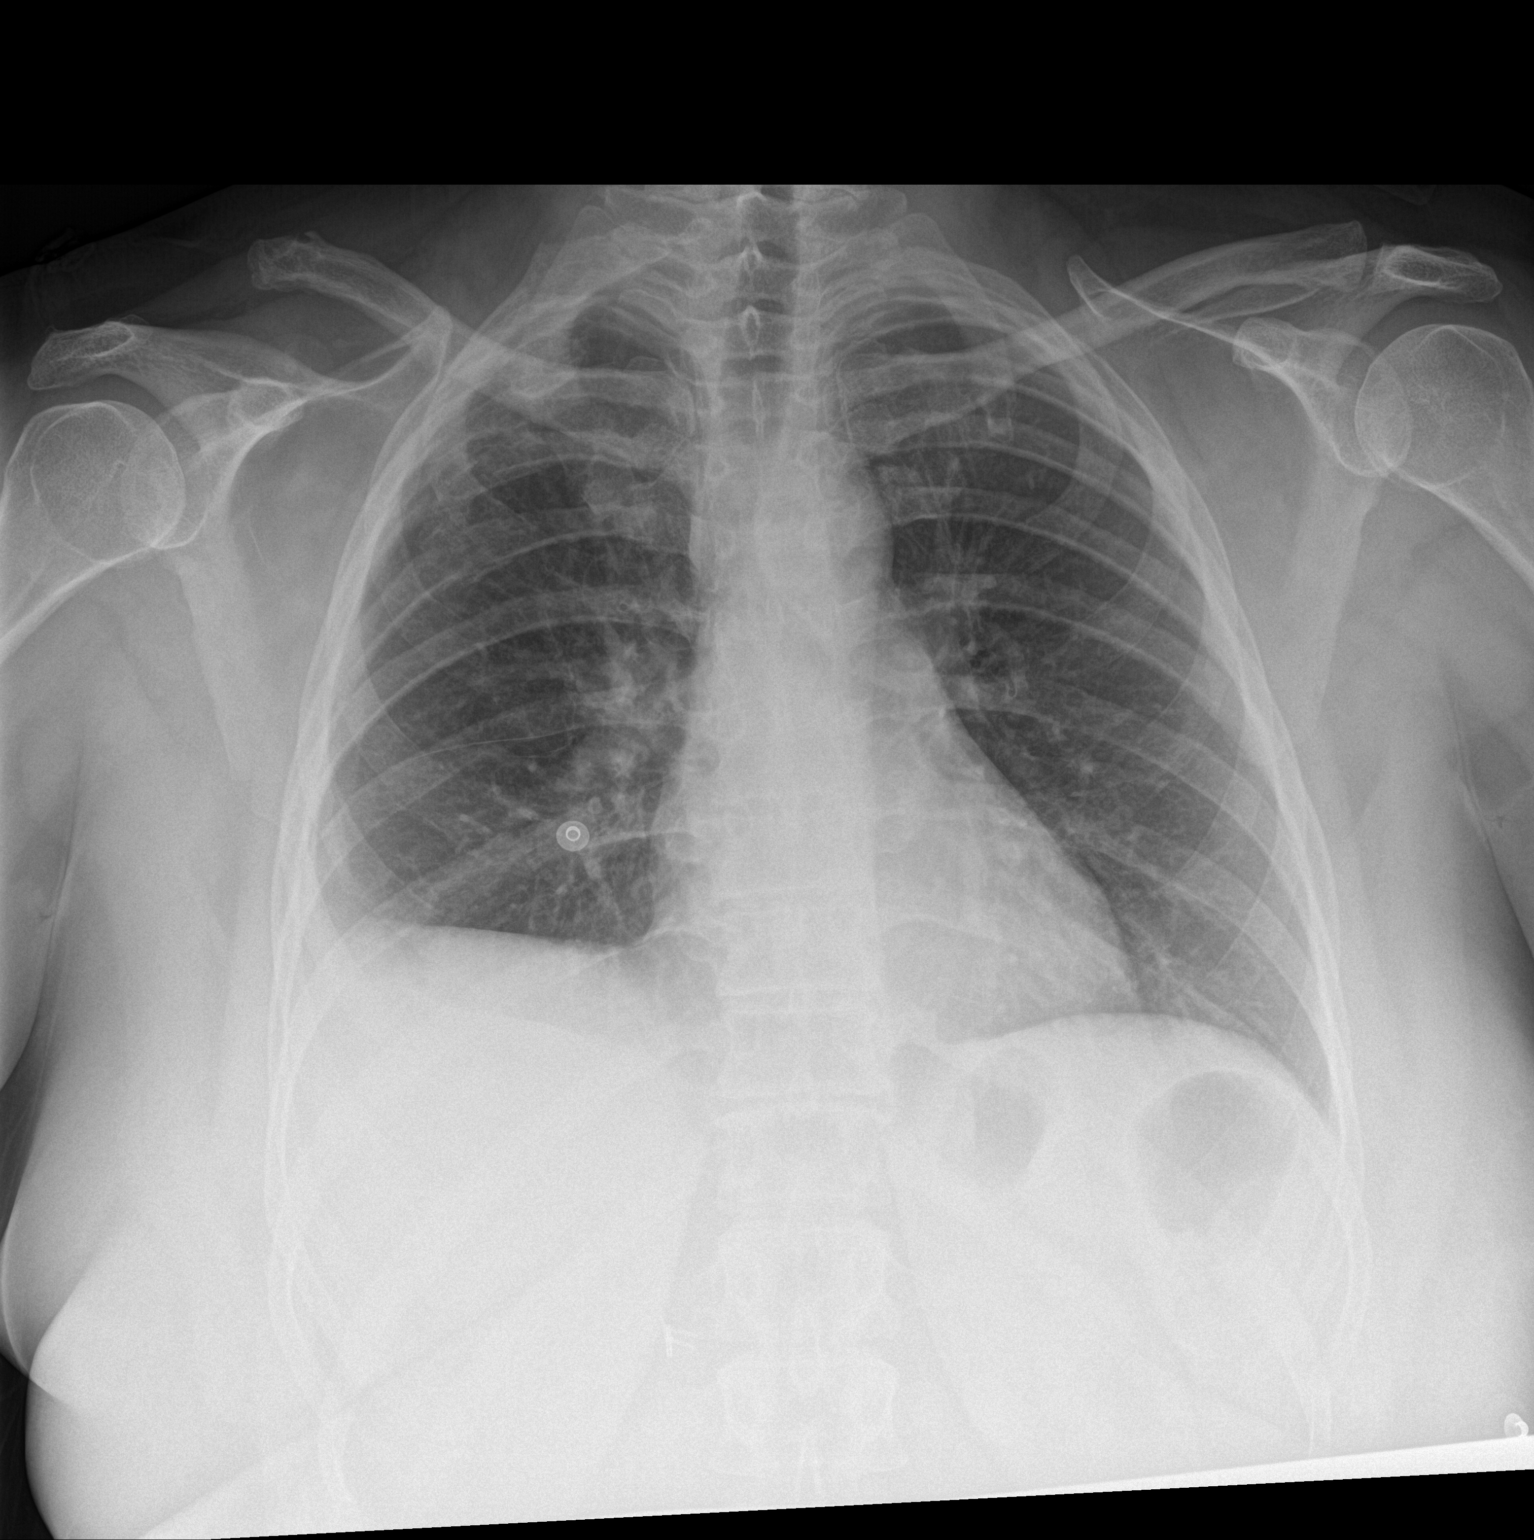

[chest lat]
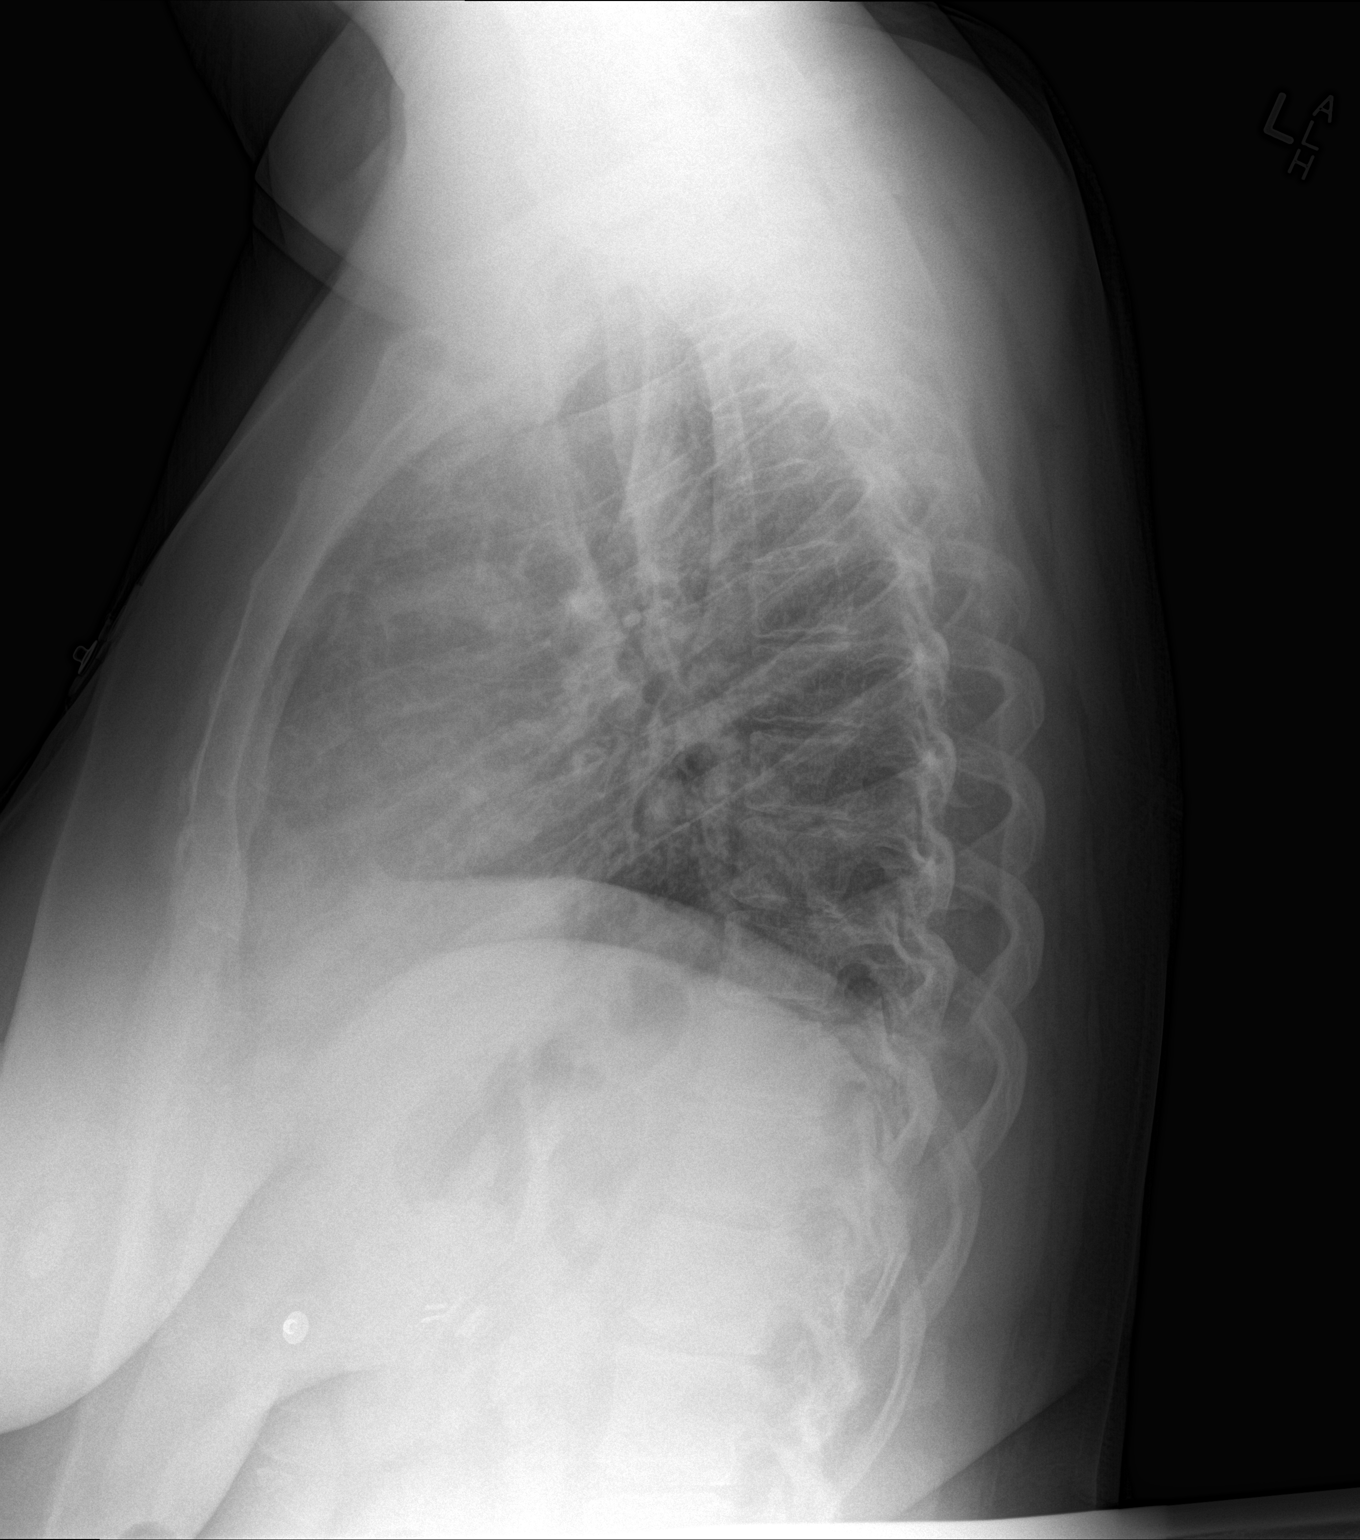

[2 of 2 positions shown; findings below may reference images not displayed]

FINDINGS: Mild elevation of the right hemidiaphragm is noted. No focal
infiltrate or sizable effusion is seen. Chronic changes about the
acromioclavicular joint on the right are noted and stable. Old
healed rib fractures are again seen on the right. Cardiac shadow is
stable.
IMPRESSION: No acute abnormality noted.

## 2015-12-26 ENCOUNTER — Ambulatory Visit
Admission: RE | Admit: 2015-12-26 | Discharge: 2015-12-26 | Disposition: A | Discharge status: Home / Self Care | Payer: Medicaid Other | Source: Ambulatory Visit | Attending: Cardiovascular Disease | Admitting: Cardiovascular Disease

## 2015-12-26 ENCOUNTER — Other Ambulatory Visit: Payer: Self-pay | Admitting: Cardiovascular Disease

## 2015-12-26 DIAGNOSIS — R05 Cough: Secondary | ICD-10-CM

## 2015-12-26 DIAGNOSIS — R059 Cough, unspecified: Secondary | ICD-10-CM

## 2015-12-26 DIAGNOSIS — R0602 Shortness of breath: Secondary | ICD-10-CM

## 2016-02-20 ENCOUNTER — Encounter: Payer: Self-pay | Admitting: Obstetrics & Gynecology

## 2016-02-20 ENCOUNTER — Other Ambulatory Visit (HOSPITAL_COMMUNITY)
Admission: RE | Admit: 2016-02-20 | Discharge: 2016-02-20 | Disposition: A | Discharge status: Home / Self Care | Payer: Medicaid Other | Source: Ambulatory Visit | Attending: Obstetrics & Gynecology | Admitting: Obstetrics & Gynecology

## 2016-02-20 ENCOUNTER — Ambulatory Visit (INDEPENDENT_AMBULATORY_CARE_PROVIDER_SITE_OTHER): Payer: Medicaid Other | Admitting: Obstetrics & Gynecology

## 2016-02-20 VITALS — BP 114/72 | HR 79 | Temp 98.4°F | Ht 65.0 in | Wt 235.2 lb

## 2016-02-20 DIAGNOSIS — N939 Abnormal uterine and vaginal bleeding, unspecified: Secondary | ICD-10-CM | POA: Diagnosis present

## 2016-02-20 DIAGNOSIS — Z1231 Encounter for screening mammogram for malignant neoplasm of breast: Secondary | ICD-10-CM

## 2016-02-20 LAB — POCT PREGNANCY, URINE: Preg Test, Ur: NEGATIVE

## 2016-02-20 MED ORDER — MEGESTROL ACETATE 40 MG PO TABS: 80.0000 mg | ORAL_TABLET | Freq: Two times a day (BID) | ORAL | Status: DC
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
## 2016-02-20 NOTE — Progress Notes (Signed)
Mammogram scheduled for April 21st 4:20.   Pt notified.

## 2016-02-20 NOTE — Patient Instructions (Signed)

## 2016-02-25 ENCOUNTER — Telehealth: Payer: Self-pay | Admitting: Neurology

## 2016-02-25 ENCOUNTER — Telehealth: Payer: Self-pay | Admitting: General Practice

## 2016-02-25 NOTE — Telephone Encounter (Signed)
[name] [name] returned a call. I informed her per Dr. Harolyn Rutherford endometrial biopsy was negative.  She asked if she was still supposed to be taking the medicine for cancer- I asked her which medicine- she states megace. i infomed her that is for abnormal bleeding- to help prevent heavy bleeding. She states she is not bleeding anymore and we discussed if she stops taking the medicine she will likely bleed heavy again. She states it is causing her blood sugars to go up- I attempted to encourage her to discuss with her pcp but Simeon cut the call short.

## 2016-02-25 NOTE — Telephone Encounter (Signed)
Per Dr Harolyn Rutherford, patient's endo bx was negative. Called patient and a female answered stating she wasn't in right now. Told her to have the patient call us back when she can- the woman stated she would

## 2016-02-25 NOTE — Telephone Encounter (Signed)
Patient is calling to schedule an appointment for her medication, migraines.  Can she be worked in?

## 2016-02-26 NOTE — Telephone Encounter (Signed)
No answer when returned call. 

## 2016-02-26 NOTE — Telephone Encounter (Signed)
Patient returned Sandy's call °

## 2016-02-26 NOTE — Telephone Encounter (Signed)
No answer this am.  

## 2016-02-27 NOTE — Telephone Encounter (Signed)
Pt returned Sandy's call. When RN returns the call, if pt is not available to please speak with her husband and can schedule appt with him.

## 2016-02-27 NOTE — Telephone Encounter (Signed)
Made appt for pt 02-29-16 at 0815.  Pt confirmed.

## 2016-02-27 NOTE — Telephone Encounter (Signed)
Busy signal

## 2016-02-29 ENCOUNTER — Telehealth: Payer: Self-pay | Admitting: Neurology

## 2016-02-29 ENCOUNTER — Encounter: Payer: Self-pay | Admitting: Neurology

## 2016-02-29 ENCOUNTER — Ambulatory Visit (INDEPENDENT_AMBULATORY_CARE_PROVIDER_SITE_OTHER): Payer: Medicaid Other | Admitting: Neurology

## 2016-02-29 VITALS — BP 133/72 | HR 86 | Ht 63.75 in | Wt 239.0 lb

## 2016-02-29 DIAGNOSIS — G43009 Migraine without aura, not intractable, without status migrainosus: Secondary | ICD-10-CM | POA: Diagnosis not present

## 2016-02-29 DIAGNOSIS — M797 Fibromyalgia: Secondary | ICD-10-CM

## 2016-02-29 MED ORDER — SUMATRIPTAN 5 MG/ACT NA SOLN: 1.0000 | Freq: Two times a day (BID) | NASAL | Status: DC | PRN

## 2016-02-29 MED ORDER — PREGABALIN 25 MG PO CAPS: ORAL_CAPSULE | ORAL | Status: DC

## 2016-02-29 MED ORDER — TIZANIDINE HCL 2 MG PO TABS: 2.0000 mg | ORAL_TABLET | Freq: Three times a day (TID) | ORAL | Status: DC

## 2016-02-29 NOTE — Telephone Encounter (Signed)
Patient called, was seen by Dr. Jannifer Franklin this morning, states Medicaid won't approve pregabalin (LYRICA) 25 MG capsule states it needs an over-ride and she told Dr. Jannifer Franklin that it would at her visit this morning. Patient would like this expedited because "it took 6 months last time". Pharmacy is [facility] on [address] and [address].
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
## 2016-03-03 ENCOUNTER — Encounter: Payer: Self-pay | Admitting: *Deleted

## 2016-03-03 NOTE — Telephone Encounter (Signed)
Called [employer] (Medicaid) and completed PA - approved through 02/26/2017.  Patient has been notified.

## 2016-03-03 NOTE — Telephone Encounter (Signed)
Patient called to check status of message left Friday 02/29/16 regarding Lyrica. Pharmacy has advised this medication is still not going through.

## 2016-03-14 ENCOUNTER — Ambulatory Visit: Payer: Medicaid Other

## 2016-03-19 ENCOUNTER — Ambulatory Visit: Payer: Medicaid Other | Admitting: Obstetrics & Gynecology

## 2016-03-26 ENCOUNTER — Telehealth: Payer: Self-pay | Admitting: Neurology

## 2016-03-26 ENCOUNTER — Observation Stay (HOSPITAL_COMMUNITY): Payer: Medicaid Other

## 2016-03-26 ENCOUNTER — Observation Stay (HOSPITAL_COMMUNITY)
Admission: AD | Admit: 2016-03-26 | Discharge: 2016-03-27 | Disposition: A | Discharge status: Home / Self Care | Payer: Medicaid Other | Source: Ambulatory Visit | Attending: Cardiovascular Disease | Admitting: Cardiovascular Disease

## 2016-03-26 DIAGNOSIS — R079 Chest pain, unspecified: Secondary | ICD-10-CM | POA: Diagnosis present

## 2016-03-26 DIAGNOSIS — M797 Fibromyalgia: Secondary | ICD-10-CM | POA: Diagnosis present

## 2016-03-26 DIAGNOSIS — M549 Dorsalgia, unspecified: Secondary | ICD-10-CM | POA: Diagnosis not present

## 2016-03-26 DIAGNOSIS — E119 Type 2 diabetes mellitus without complications: Secondary | ICD-10-CM | POA: Diagnosis not present

## 2016-03-26 DIAGNOSIS — F17210 Nicotine dependence, cigarettes, uncomplicated: Secondary | ICD-10-CM | POA: Insufficient documentation

## 2016-03-26 DIAGNOSIS — R0602 Shortness of breath: Secondary | ICD-10-CM | POA: Diagnosis present

## 2016-03-26 DIAGNOSIS — R0789 Other chest pain: Secondary | ICD-10-CM | POA: Diagnosis not present

## 2016-03-26 DIAGNOSIS — F1290 Cannabis use, unspecified, uncomplicated: Secondary | ICD-10-CM | POA: Diagnosis not present

## 2016-03-26 DIAGNOSIS — E669 Obesity, unspecified: Secondary | ICD-10-CM | POA: Diagnosis not present

## 2016-03-26 DIAGNOSIS — J449 Chronic obstructive pulmonary disease, unspecified: Secondary | ICD-10-CM | POA: Insufficient documentation

## 2016-03-26 DIAGNOSIS — R600 Localized edema: Secondary | ICD-10-CM | POA: Diagnosis present

## 2016-03-26 DIAGNOSIS — Z7984 Long term (current) use of oral hypoglycemic drugs: Secondary | ICD-10-CM | POA: Insufficient documentation

## 2016-03-26 DIAGNOSIS — Z9119 Patient's noncompliance with other medical treatment and regimen: Secondary | ICD-10-CM | POA: Diagnosis not present

## 2016-03-26 DIAGNOSIS — G8929 Other chronic pain: Secondary | ICD-10-CM | POA: Diagnosis present

## 2016-03-26 DIAGNOSIS — R0600 Dyspnea, unspecified: Secondary | ICD-10-CM | POA: Diagnosis present

## 2016-03-26 DIAGNOSIS — I10 Essential (primary) hypertension: Secondary | ICD-10-CM | POA: Diagnosis not present

## 2016-03-26 DIAGNOSIS — R0689 Other abnormalities of breathing: Secondary | ICD-10-CM | POA: Diagnosis present

## 2016-03-26 LAB — COMPREHENSIVE METABOLIC PANEL
ALT: 55 U/L — ABNORMAL HIGH (ref 14–54)
AST: 36 U/L (ref 15–41)
Albumin: 4.1 g/dL (ref 3.5–5.0)
Alkaline Phosphatase: 81 U/L (ref 38–126)
Anion gap: 8 (ref 5–15)
BUN: 14 mg/dL (ref 6–20)
CO2: 30 mmol/L (ref 22–32)
Calcium: 9.3 mg/dL (ref 8.9–10.3)
Chloride: 103 mmol/L (ref 101–111)
Creatinine, Ser: 0.89 mg/dL (ref 0.44–1.00)
GFR calc Af Amer: >60 mL/min (ref >60)
GFR calc non Af Amer: >60 mL/min (ref >60)
Glucose, Bld: 99 mg/dL (ref 65–99)
Potassium: 4.2 mmol/L (ref 3.5–5.1)
Sodium: 141 mmol/L (ref 135–145)
Total Bilirubin: 0.5 mg/dL (ref 0.3–1.2)
Total Protein: 6.5 g/dL (ref 6.5–8.1)

## 2016-03-26 LAB — CBC WITH DIFFERENTIAL/PLATELET
Basophils Absolute: 0 10*3/uL (ref 0.0–0.1)
Basophils Relative: 0 %
Eosinophils Absolute: 0.1 10*3/uL (ref 0.0–0.7)
Eosinophils Relative: 2 %
HCT: 40.5 % (ref 36.0–46.0)
Hemoglobin: 12.8 g/dL (ref 12.0–15.0)
Lymphocytes Relative: 51 %
Lymphs Abs: 3.7 10*3/uL (ref 0.7–4.0)
MCH: 27.5 pg (ref 26.0–34.0)
MCHC: 31.6 g/dL (ref 30.0–36.0)
MCV: 86.9 fL (ref 78.0–100.0)
Monocytes Absolute: 0.7 10*3/uL (ref 0.1–1.0)
Monocytes Relative: 9 %
Neutro Abs: 2.8 10*3/uL (ref 1.7–7.7)
Neutrophils Relative %: 38 %
Platelets: 259 10*3/uL (ref 150–400)
RBC: 4.66 MIL/uL (ref 3.87–5.11)
RDW: 15 % (ref 11.5–15.5)
WBC: 7.4 10*3/uL (ref 4.0–10.5)

## 2016-03-26 LAB — PROTIME-INR
INR: 0.96 (ref 0.00–1.49)
Prothrombin Time: 13 seconds (ref 11.6–15.2)

## 2016-03-26 LAB — GLUCOSE, CAPILLARY
Glucose-Capillary: 105 mg/dL — ABNORMAL HIGH (ref 65–99)
Glucose-Capillary: 183 mg/dL — ABNORMAL HIGH (ref 65–99)

## 2016-03-26 LAB — BRAIN NATRIURETIC PEPTIDE: B Natriuretic Peptide: 10.8 pg/mL (ref 0.0–100.0)

## 2016-03-26 LAB — APTT: aPTT: 30 seconds (ref 24–37)

## 2016-03-26 LAB — TROPONIN I: Troponin I: <0.03 ng/mL (ref <0.031)

## 2016-03-26 MED ORDER — ACETAMINOPHEN 500 MG PO TABS
500.0000 mg | ORAL_TABLET | Freq: Four times a day (QID) | ORAL | Status: DC | PRN
  Administered 2016-03-26: 500 mg via ORAL
  Filled 2016-03-26: qty 1

## 2016-03-26 MED ORDER — NITROGLYCERIN 0.4 MG SL SUBL: 0.4000 mg | SUBLINGUAL_TABLET | SUBLINGUAL | Status: DC | PRN

## 2016-03-26 MED ORDER — MOMETASONE FURO-FORMOTEROL FUM 200-5 MCG/ACT IN AERO
2.0000 | INHALATION_SPRAY | Freq: Two times a day (BID) | RESPIRATORY_TRACT | Status: DC
  Filled 2016-03-26: qty 8.8

## 2016-03-26 MED ORDER — DICLOFENAC SODIUM 1 % TD GEL
2.0000 g | Freq: Four times a day (QID) | TRANSDERMAL | Status: DC | PRN
  Filled 2016-03-26: qty 100

## 2016-03-26 MED ORDER — PRAVASTATIN SODIUM 40 MG PO TABS
40.0000 mg | ORAL_TABLET | Freq: Every day | ORAL | Status: DC
  Administered 2016-03-26 – 2016-03-27 (×2): 40 mg via ORAL
  Filled 2016-03-26 (×2): qty 1

## 2016-03-26 MED ORDER — OXYCODONE HCL 5 MG PO TABS
5.0000 mg | ORAL_TABLET | Freq: Two times a day (BID) | ORAL | Status: DC
  Administered 2016-03-26 – 2016-03-27 (×2): 5 mg via ORAL
  Filled 2016-03-26 (×2): qty 1

## 2016-03-26 MED ORDER — PROMETHAZINE HCL 25 MG PO TABS
25.0000 mg | ORAL_TABLET | Freq: Four times a day (QID) | ORAL | Status: DC | PRN
Start: 2016-03-26 — End: 2016-03-27

## 2016-03-26 MED ORDER — LOSARTAN POTASSIUM 50 MG PO TABS
50.0000 mg | ORAL_TABLET | Freq: Every day | ORAL | Status: DC
  Administered 2016-03-26 – 2016-03-27 (×2): 50 mg via ORAL
  Filled 2016-03-26 (×2): qty 1

## 2016-03-26 MED ORDER — AMLODIPINE BESYLATE 5 MG PO TABS
5.0000 mg | ORAL_TABLET | Freq: Every day | ORAL | Status: DC
  Administered 2016-03-27: 5 mg via ORAL
  Filled 2016-03-26: qty 1

## 2016-03-26 MED ORDER — HEPARIN SODIUM (PORCINE) 5000 UNIT/ML IJ SOLN
5000.0000 [IU] | Freq: Three times a day (TID) | INTRAMUSCULAR | Status: DC
  Administered 2016-03-26 – 2016-03-27 (×2): 5000 [IU] via SUBCUTANEOUS
  Filled 2016-03-26 (×2): qty 1

## 2016-03-26 MED ORDER — SODIUM CHLORIDE 0.9% FLUSH
3.0000 mL | Freq: Two times a day (BID) | INTRAVENOUS | Status: DC
  Administered 2016-03-26 – 2016-03-27 (×2): 3 mL via INTRAVENOUS

## 2016-03-26 MED ORDER — TIZANIDINE HCL 4 MG PO TABS
2.0000 mg | ORAL_TABLET | Freq: Every day | ORAL | Status: DC
Start: 2016-03-26 — End: 2016-03-27
  Administered 2016-03-26: 2 mg via ORAL
  Filled 2016-03-26: qty 1

## 2016-03-26 MED ORDER — OXYCODONE-ACETAMINOPHEN 10-325 MG PO TABS: 1.0000 | ORAL_TABLET | Freq: Two times a day (BID) | ORAL | Status: DC

## 2016-03-26 MED ORDER — NICOTINE 21 MG/24HR TD PT24
21.0000 mg | MEDICATED_PATCH | Freq: Every day | TRANSDERMAL | Status: DC
  Administered 2016-03-26 – 2016-03-27 (×2): 21 mg via TRANSDERMAL
  Filled 2016-03-26 (×2): qty 1

## 2016-03-26 MED ORDER — SODIUM CHLORIDE 0.9 % IV SOLN: 250.0000 mL | INTRAVENOUS | Status: DC | PRN

## 2016-03-26 MED ORDER — OXYCODONE-ACETAMINOPHEN 5-325 MG PO TABS
1.0000 | ORAL_TABLET | Freq: Two times a day (BID) | ORAL | Status: DC
  Administered 2016-03-26: 1 via ORAL
  Filled 2016-03-26: qty 1

## 2016-03-26 MED ORDER — FUROSEMIDE 10 MG/ML IJ SOLN
40.0000 mg | Freq: Once | INTRAMUSCULAR | Status: AC
  Administered 2016-03-26: 40 mg via INTRAVENOUS
  Filled 2016-03-26: qty 4

## 2016-03-26 MED ORDER — POTASSIUM CHLORIDE CRYS ER 10 MEQ PO TBCR
10.0000 meq | EXTENDED_RELEASE_TABLET | Freq: Two times a day (BID) | ORAL | Status: DC
  Administered 2016-03-26 – 2016-03-27 (×2): 10 meq via ORAL
  Filled 2016-03-26 (×2): qty 1

## 2016-03-26 MED ORDER — CARVEDILOL 6.25 MG PO TABS
6.2500 mg | ORAL_TABLET | Freq: Two times a day (BID) | ORAL | Status: DC
  Administered 2016-03-27: 6.25 mg via ORAL
  Filled 2016-03-26: qty 1

## 2016-03-26 MED ORDER — INSULIN ASPART 100 UNIT/ML ~~LOC~~ SOLN: 0.0000 [IU] | Freq: Three times a day (TID) | SUBCUTANEOUS | Status: DC

## 2016-03-26 MED ORDER — ALBUTEROL SULFATE (2.5 MG/3ML) 0.083% IN NEBU: 2.5000 mg | INHALATION_SOLUTION | Freq: Four times a day (QID) | RESPIRATORY_TRACT | Status: DC | PRN

## 2016-03-26 MED ORDER — FAMOTIDINE 20 MG PO TABS
20.0000 mg | ORAL_TABLET | Freq: Two times a day (BID) | ORAL | Status: DC
Start: 2016-03-26 — End: 2016-03-27
  Administered 2016-03-26 – 2016-03-27 (×2): 20 mg via ORAL
  Filled 2016-03-26 (×2): qty 1

## 2016-03-26 MED ORDER — DIAZEPAM 5 MG PO TABS
5.0000 mg | ORAL_TABLET | Freq: Every day | ORAL | Status: DC
  Administered 2016-03-27: 5 mg via ORAL
  Filled 2016-03-26: qty 1

## 2016-03-26 MED ORDER — SODIUM CHLORIDE 0.9% FLUSH
3.0000 mL | Freq: Two times a day (BID) | INTRAVENOUS | Status: DC
  Administered 2016-03-27: 3 mL via INTRAVENOUS

## 2016-03-26 MED ORDER — SODIUM CHLORIDE 0.9% FLUSH: 3.0000 mL | INTRAVENOUS | Status: DC | PRN

## 2016-03-26 MED ORDER — ALPRAZOLAM 0.5 MG PO TABS: 1.0000 mg | ORAL_TABLET | Freq: Every evening | ORAL | Status: DC | PRN

## 2016-03-26 MED ORDER — METFORMIN HCL 500 MG PO TABS
500.0000 mg | ORAL_TABLET | Freq: Two times a day (BID) | ORAL | Status: DC
  Administered 2016-03-27: 500 mg via ORAL
  Filled 2016-03-26: qty 1

## 2016-03-26 NOTE — Telephone Encounter (Signed)
Returned pt TC. She c/o pain in the center of her back not relieved by current medications. She has titrated Lyrica up to 1 in the morning and 3 at night (d/t side effects of dizziness and nausea), Tizanidine TID and using Voltaren gel. Appt scheduled for Fri morning @ 8 a.m.

## 2016-03-26 NOTE — Progress Notes (Signed)
Pt oriented to room.  Call bell at reach.  Instructed to call for assistance.  Verbalized understanding.  Dr. Doylene Canard notified of pt's arrival for orders.  Instructed he will be up to see pt.  Karie Kirks, Therapist, sports.

## 2016-03-26 NOTE — Progress Notes (Signed)
Refused bed alarm. Will continue to monitor. 

## 2016-03-26 NOTE — Telephone Encounter (Signed)
I called the patient. The patient has been admitted for heart issues. The patient has had a one-week history of some discomfort between the shoulder blades and the thoracic spine. We can see the patient next week if possible, she could not come in as scheduled on Friday.

## 2016-03-26 NOTE — Telephone Encounter (Signed)
Pt called requesting to be seen today. Sts she is having pain in the "thoracic" part of her for the past couple of months. She can't get any relief.  Please call on home or cell [phone].

## 2016-03-26 NOTE — Telephone Encounter (Signed)
Patient is calling. She has an appointment with Dr. Jannifer Franklin on Friday which I have cancelled but the patient is now going to be admitted by Dr. Veneta Penton to [facility] for observation for heart problems. The patient states she would like Dr. Jannifer Franklin to come to the hospital to see her. Please call and discuss. The patient can be reached now at [phone].
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
## 2016-03-27 ENCOUNTER — Telehealth: Payer: Self-pay | Admitting: Neurology

## 2016-03-27 ENCOUNTER — Encounter (HOSPITAL_COMMUNITY): Payer: Self-pay | Admitting: General Practice

## 2016-03-27 ENCOUNTER — Observation Stay (HOSPITAL_COMMUNITY): Payer: Medicaid Other

## 2016-03-27 DIAGNOSIS — R0789 Other chest pain: Secondary | ICD-10-CM | POA: Diagnosis not present

## 2016-03-27 LAB — CBC
HCT: 41.9 % (ref 36.0–46.0)
Hemoglobin: 12.9 g/dL (ref 12.0–15.0)
MCH: 27.2 pg (ref 26.0–34.0)
MCHC: 30.8 g/dL (ref 30.0–36.0)
MCV: 88.4 fL (ref 78.0–100.0)
Platelets: 246 10*3/uL (ref 150–400)
RBC: 4.74 MIL/uL (ref 3.87–5.11)
RDW: 15 % (ref 11.5–15.5)
WBC: 7.3 10*3/uL (ref 4.0–10.5)

## 2016-03-27 LAB — BASIC METABOLIC PANEL
Anion gap: 12 (ref 5–15)
BUN: 18 mg/dL (ref 6–20)
CO2: 26 mmol/L (ref 22–32)
Calcium: 9.3 mg/dL (ref 8.9–10.3)
Chloride: 102 mmol/L (ref 101–111)
Creatinine, Ser: 1.03 mg/dL — ABNORMAL HIGH (ref 0.44–1.00)
GFR calc Af Amer: >60 mL/min (ref >60)
GFR calc non Af Amer: >60 mL/min (ref >60)
Glucose, Bld: 132 mg/dL — ABNORMAL HIGH (ref 65–99)
Potassium: 4.1 mmol/L (ref 3.5–5.1)
Sodium: 140 mmol/L (ref 135–145)

## 2016-03-27 LAB — HEMOGLOBIN A1C
Hgb A1c MFr Bld: 6.3 % — ABNORMAL HIGH (ref 4.8–5.6)
Mean Plasma Glucose: 134 mg/dL

## 2016-03-27 LAB — GLUCOSE, CAPILLARY: Glucose-Capillary: 107 mg/dL — ABNORMAL HIGH (ref 65–99)

## 2016-03-27 LAB — TROPONIN I
Troponin I: <0.03 ng/mL (ref <0.031)
Troponin I: <0.03 ng/mL (ref <0.031)

## 2016-03-27 LAB — PROTIME-INR
INR: 1.02 (ref 0.00–1.49)
Prothrombin Time: 13.6 seconds (ref 11.6–15.2)

## 2016-03-27 LAB — ECHOCARDIOGRAM COMPLETE
Height: 61 in
Weight: 3873.6 oz

## 2016-03-27 MED ORDER — METFORMIN HCL 500 MG PO TABS: 500.0000 mg | ORAL_TABLET | Freq: Two times a day (BID) | ORAL | Status: DC

## 2016-03-27 MED ORDER — PREGABALIN 25 MG PO CAPS: ORAL_CAPSULE | ORAL | Status: DC

## 2016-03-27 MED ORDER — OXYCODONE HCL 5 MG PO TABS: 5.0000 mg | ORAL_TABLET | Freq: Three times a day (TID) | ORAL | Status: DC | PRN

## 2016-03-27 MED ORDER — MEGESTROL ACETATE 40 MG PO TABS
80.0000 mg | ORAL_TABLET | Freq: Two times a day (BID) | ORAL | Status: DC
  Administered 2016-03-27: 80 mg via ORAL
  Filled 2016-03-27: qty 2

## 2016-03-27 MED ORDER — PREGABALIN 25 MG PO CAPS: 25.0000 mg | ORAL_CAPSULE | Freq: Every day | ORAL | Status: DC

## 2016-03-27 NOTE — Progress Notes (Signed)
CM CONSULT FOR MEDICATION ASSISTANCE  CM talked to patient about medication. Patient has Medicaid insurance and her prescriptions cost $3.00 or less. Patient stated that she is on Disability and is on about 23 medications. Pharmacy of choice is [facility] and she has been getting some of her medications in a 90 day supply. Patient also smokes 3 packs of cigarettes a day. CM talked to patient about possibly cutting back or quit smoking so that more money will be available to use towards her medication. Patient stated that she will try to make the adjustments to stop smoking. Mindi Slicker [facility] [id number]  Member: [name] [name], [name] [name] [id number]  Plan: MEDICAID [place] ACCESS [246* Payor: MEDICAID [place] [246]    Coverage Information    Coverage information:     Subscriber: [id number] [name] [name]     Rel to sub: 01 - Self     Member ID: [id number]     Payor: 246-MEDICAID [place]     Benefit plan: [id number]-MEDICAID [place] Ph: [phone]     Group number: Not given     Member effective dates: from 09/24/13

## 2016-03-27 NOTE — Discharge Summary (Signed)
Physician Discharge Summary  Patient ID: [name] [name] MRN: [id number] DOB/AGE: Feb 12, 1971 45 y.o.  Admit date: 03/26/2016 Discharge date: 03/27/2016  Admission Diagnoses: Shortness of breath Bilateral leg edema Fibromyalgia Obesity Hypertension Tobacco use disorder Marijuana use disorder Back pain  Discharge Diagnoses:  Principal Problem: * Shortness of breath on exertion * Active Problems:   Chest pain at rest   Fibromyalgia   Chronic back pain   Bilateral leg edema   [employer] use disorder   Marijuana use disorder   Hypertension   DM, II   Morbid obesity  Discharged Condition: fair  Hospital Course: 45 year old female presents with recurrent shortness of breath and leg swelling poorly responding to oral lasix. Patient admits to non-compliance on fluid intake saying she always drank lot of fluids. She has Chronic back disease with lumbar disc disorder, Chronic obstructive lung disease, Fibromyalgia and chronic use of pain medications but over 2 year period her oxycodone use has decreased by 50 % or more. Her cardiac work up was negative for congestive heart failure. She was advised to decrease fluid intake, modify her diet and use lasix as needed only. She is going to try using Lyrica over pain medications which will be further reduced to as needed only as 5 mg. oxycodone from 10 mg. oxycodone.  Consults: cardiology  Significant Diagnostic Studies: labs: Normal CBC, BMET, Troponin-I and BNP.  EKG-NSR.  Chest X-ray-Normal.  Echocardiogram-Essentially normal.  Treatments: cardiac meds: carvedilol, amlodipine, furosemide, losartan, potassium and Pravastatin.  Discharge Exam: Blood pressure 100/65, pulse 69, temperature 98.3 F (36.8 C), temperature source Oral, resp. rate 20, height 5\' 1"  (1.549 m), weight 109.816 kg (242 lb 1.6 oz), SpO2 97 %. HEENT: [place]/AT, Eyes-Blue, PERL, EOMI, Conjunctiva-Pink, Sclera-Non-icteric Neck: No JVD, No bruit, Trachea midline. Lungs:  Clear, Bilateral. Cardiac: Regular rhythm, normal S1 and S2, no S3.  Abdomen: Soft, non-tender. Extremities: Trace edema present. No cyanosis. No clubbing. CNS: AxOx3, Cranial nerves grossly intact, moves all 4 extremities. Right handed. Skin: Warm and dry. Psych: Anxiety.  Disposition: 01-Home or Self Care     Medication List    STOP taking these medications        lisinopril 10 MG tablet  Commonly known as:  PRINIVIL,ZESTRIL     Lumbar Back Brace/Support Pad Misc     oxyCODONE-acetaminophen 10-325 MG tablet  Commonly known as:  PERCOCET      TAKE these medications        acetaminophen 500 MG tablet  Commonly known as:  TYLENOL  Take 500 mg by mouth every 6 (six) hours as needed for moderate pain.     albuterol 108 (90 Base) MCG/ACT inhaler  Commonly known as:  PROVENTIL HFA;VENTOLIN HFA  Inhale 2 puffs into the lungs every 6 (six) hours as needed. For shortness of breath     ALPRAZolam 1 MG tablet  Commonly known as:  XANAX  Take 1 mg by mouth 3 (three) times daily as needed for anxiety or sleep.     amLODipine 5 MG tablet  Commonly known as:  NORVASC  Take 5 mg by mouth daily.     budesonide-formoterol 160-4.5 MCG/ACT inhaler  Commonly known as:  SYMBICORT  Inhale 2 puffs into the lungs 2 (two) times daily as needed (for shortness of breath).     Calcium Carb-Cholecalciferol 600-800 MG-UNIT Tabs  Take 1 tablet by mouth daily.     carvedilol 6.25 MG tablet  Commonly known as:  COREG  Take 6.25 mg by mouth  2 (two) times daily with a meal.     diazepam 5 MG tablet  Commonly known as:  VALIUM  Take 5 mg by mouth daily as needed for anxiety.     diclofenac sodium 1 % Gel  Commonly known as:  VOLTAREN  Apply 1 application topically 4 (four) times daily as needed (for pain). For pain     fluorometholone 0.1 % ophthalmic suspension  Commonly known as:  FML  Place 1 drop into both eyes 4 (four) times daily. Patient states she started this medication on  April 31st. Take for 8 weeks only     furosemide 20 MG tablet  Commonly known as:  LASIX  Take 20 mg by mouth 2 (two) times daily.     hydroxypropyl methylcellulose / hypromellose 2.5 % ophthalmic solution  Commonly known as:  ISOPTO TEARS / GONIOVISC  Place 1 drop into both eyes 4 (four) times daily.     ketotifen 0.025 % ophthalmic solution  Commonly known as:  ZADITOR  Place 1 drop into both eyes 4 (four) times daily.     losartan 50 MG tablet  Commonly known as:  COZAAR  Take 1 tablet (50 mg total) by mouth daily.     megestrol 40 MG tablet  Commonly known as:  MEGACE  Take 2 tablets (80 mg total) by mouth 2 (two) times daily. Can increase to two tablets twice a day in the event of heavy bleeding     metFORMIN 500 MG tablet  Commonly known as:  GLUCOPHAGE  Take 1 tablet (500 mg total) by mouth 2 (two) times daily with a meal.     multivitamin with minerals Tabs tablet  Take 1 tablet by mouth daily.     mupirocin ointment 2 %  Commonly known as:  BACTROBAN  Apply 1 application topically as needed (for bumps).     nitroGLYCERIN 0.4 MG SL tablet  Commonly known as:  NITROSTAT  Place 0.4 mg under the tongue every 5 (five) minutes as needed for chest pain.     nystatin 100000 UNIT/ML suspension  Commonly known as:  MYCOSTATIN  Take 500,000 Units by mouth 4 (four) times daily as needed (for oral swelling).     oxyCODONE 5 MG immediate release tablet  Commonly known as:  Oxy IR/ROXICODONE  Take 1 tablet (5 mg total) by mouth 3 (three) times daily as needed for severe pain.     potassium chloride 10 MEQ CR capsule  Commonly known as:  MICRO-K  Take 10 mEq by mouth daily.     pravastatin 40 MG tablet  Commonly known as:  PRAVACHOL  Take 40 mg by mouth every evening.     pregabalin 25 MG capsule  Commonly known as:  LYRICA  One tablet at bed time.     promethazine 25 MG tablet  Commonly known as:  PHENERGAN  Take 25 mg by mouth every 6 (six) hours as needed for  nausea or vomiting.     Propylene Glycol 0.6 % Soln  Place 1 drop into both eyes 4 (four) times daily.     ranitidine 300 MG tablet  Commonly known as:  ZANTAC  Take 300 mg by mouth 2 (two) times daily.     SUMAtriptan 5 MG/ACT nasal spray  Commonly known as:  IMITREX  Place 1 spray (5 mg total) into the nose 2 (two) times daily as needed for migraine.     tiZANidine 2 MG tablet  Commonly known as:  ZANAFLEX  Take 1 tablet (2 mg total) by mouth 3 (three) times daily.     vitamin B-12 1000 MCG tablet  Commonly known as:  CYANOCOBALAMIN  Take 1,000 mcg by mouth 2 (two) times daily.           Follow-up Information    Follow up with [facility], MD. Schedule an appointment as soon as possible for a visit in 1 month.   Specialty:  Cardiology   Contact information:   [place] [address] [phone]       Follow up with Lenor Coffin, MD. Schedule an appointment as soon as possible for a visit in 1 week.   Specialty:  Neurology   Contact information:   [address] [place] [address] [phone]       Signed: Birdie Riddle 03/27/2016, 11:14 AM

## 2016-03-27 NOTE — Telephone Encounter (Signed)
Patient called requesting to keep original appointment 03/28/16. Advised appointment was cancelled, this appointment time is no longer available (per Dr. Jannifer Franklin note today 5/4, will see patient back in office next week). Please schedule patient with Dr. Jannifer Franklin next week or advise if patient can see NP and I will call patient to schedule. Patient states "I think they are going to need to do a test on me, I think it's going to warrant a test, like a nerve test to find out what the problem is, paine medicine is not helping it".

## 2016-03-27 NOTE — Telephone Encounter (Signed)
Dr. Doylene Canard contact me concerning the patient. The patient may be discharged from the hospital today or possibly tomorrow morning. The patient has complained of neuromuscular discomfort in the midthoracic area. They have decreased the oxycodone to the 5 mg tablets, cutting the opiate medication dosing in half. I will see the patient back in office next week.

## 2016-03-27 NOTE — Progress Notes (Signed)
Ref: [name] Riddle, MD   Subjective:  Feeling better. Aware of decrease in pain medication use and start Lyrica use per neurologist, She is not able to tolerate more than one Lyrica at bed time. Ready to cut down to 5 mg. Oxycodone with Lyrica use.  Objective:  Vital Signs in the last 24 hours: Temp:  [98.3 F (36.8 C)-98.7 F (37.1 C)] 98.3 F (36.8 C) (05/04 0640) Pulse Rate:  [68-94] 69 (05/04 0640) Cardiac Rhythm:  [-] Normal sinus rhythm (05/04 0823) Resp:  [18-20] 20 (05/04 0640) BP: (100-124)/(54-77) 100/65 mmHg (05/04 0640) SpO2:  [93 %-97 %] 97 % (05/04 0640) Weight:  [109.816 kg (242 lb 1.6 oz)-110.587 kg (243 lb 12.8 oz)] 109.816 kg (242 lb 1.6 oz) (05/04 0640)  Physical Exam: BP Readings from Last 1 Encounters:  03/27/16 100/65    Wt Readings from Last 1 Encounters:  03/27/16 109.816 kg (242 lb 1.6 oz)    Weight change:   HEENT: [place]/AT, Eyes-Blue, PERL, EOMI, Conjunctiva-Pink, Sclera-Non-icteric Neck: No JVD, No bruit, Trachea midline. Lungs:  Clear, Bilateral. Cardiac:  Regular rhythm, normal S1 and S2, no S3.  Abdomen:  Soft, non-tender. Extremities:  Trace edema present. No cyanosis. No clubbing. CNS: AxOx3, Cranial nerves grossly intact, moves all 4 extremities. Right handed. Skin: Warm and dry.   Intake/Output from previous day: 05/03 0701 - 05/04 0700 In: 480 [P.O.:480] Out: 1800 [Urine:1800]    Lab Results: BMET    Component Value Date/Time   NA 140 03/27/2016 0636   NA 141 03/26/2016 1859   NA 136 05/04/2015 1334   K 4.1 03/27/2016 0636   K 4.2 03/26/2016 1859   K 4.2 05/04/2015 1334   CL 102 03/27/2016 0636   CL 103 03/26/2016 1859   CL 104 05/04/2015 1334   CO2 26 03/27/2016 0636   CO2 30 03/26/2016 1859   CO2 24 05/04/2015 1334   GLUCOSE 132* 03/27/2016 0636   GLUCOSE 99 03/26/2016 1859   GLUCOSE 129* 05/04/2015 1334   BUN 18 03/27/2016 0636   BUN 14 03/26/2016 1859   BUN 9 05/04/2015 1334   CREATININE 1.03* 03/27/2016 0636   CREATININE 0.89 03/26/2016 1859   CREATININE 0.75 05/04/2015 1334   CALCIUM 9.3 03/27/2016 0636   CALCIUM 9.3 03/26/2016 1859   CALCIUM 8.8* 05/04/2015 1334   GFRNONAA >60 03/27/2016 0636   GFRNONAA >60 03/26/2016 1859   GFRNONAA >60 05/04/2015 1334   GFRAA >60 03/27/2016 0636   GFRAA >60 03/26/2016 1859   GFRAA >60 05/04/2015 1334   CBC    Component Value Date/Time   WBC 7.3 03/27/2016 0636   RBC 4.74 03/27/2016 0636   HGB 12.9 03/27/2016 0636   HCT 41.9 03/27/2016 0636   PLT 246 03/27/2016 0636   MCV 88.4 03/27/2016 0636   MCH 27.2 03/27/2016 0636   MCHC 30.8 03/27/2016 0636   RDW 15.0 03/27/2016 0636   LYMPHSABS 3.7 03/26/2016 1859   MONOABS 0.7 03/26/2016 1859   EOSABS 0.1 03/26/2016 1859   BASOSABS 0.0 03/26/2016 1859   HEPATIC Function Panel  Recent Labs  03/26/16 1859  PROT 6.5   HEMOGLOBIN A1C No components found for: HGA1C,  MPG CARDIAC ENZYMES Lab Results  Component Value Date   TROPONINI <0.03 03/27/2016   TROPONINI <0.03 03/27/2016   TROPONINI <0.03 03/26/2016   BNP No results for input(s): PROBNP in the last 8760 hours. TSH No results for input(s): TSH in the last 8760 hours. CHOLESTEROL No results for input(s): CHOL in the  last 8760 hours.  Scheduled Meds: . amLODipine  5 mg Oral Daily  . carvedilol  6.25 mg Oral BID WC  . diazepam  5 mg Oral Daily  . famotidine  20 mg Oral BID  . heparin  5,000 Units Subcutaneous Q8H  . insulin aspart  0-20 Units Subcutaneous TID WC  . losartan  50 mg Oral Daily  . megestrol  80 mg Oral BID  . metFORMIN  500 mg Oral BID WC  . mometasone-formoterol  2 puff Inhalation BID  . nicotine  21 mg Transdermal Daily  . oxyCODONE  5 mg Oral BID  . potassium chloride  10 mEq Oral BID  . pravastatin  40 mg Oral Daily  . pregabalin  25 mg Oral QHS  . sodium chloride flush  3 mL Intravenous Q12H  . sodium chloride flush  3 mL Intravenous Q12H  . tiZANidine  2 mg Oral QHS   Continuous Infusions:  PRN  Meds:.sodium chloride, acetaminophen, albuterol, ALPRAZolam, diclofenac sodium, nitroGLYCERIN, promethazine, sodium chloride flush  Assessment/Plan: Shortness of breath Bilateral leg edema CHF ruled out with normal BNP and chest x-ray. Fibromyalgia Obesity Hypertension Tobacco use disorder Marijuana use disorder Back pain  Decrease Oxycodone to 5 mg. 2-3 times daily from 10 mg. Lyrica as tolerated. May shower. Awaiting echocardiogram.       Dixie Dials  MD  03/27/2016, 9:25 AM

## 2016-03-27 NOTE — Telephone Encounter (Signed)
Please call patient [phone].

## 2016-03-27 NOTE — Progress Notes (Signed)
Echocardiogram 2D Echocardiogram has been performed.  Aggie Cosier 03/27/2016, 10:01 AM

## 2016-03-27 NOTE — Telephone Encounter (Signed)
Returned pt call. Appt rescheduled for tomorrow morning.

## 2016-03-28 ENCOUNTER — Ambulatory Visit (INDEPENDENT_AMBULATORY_CARE_PROVIDER_SITE_OTHER): Payer: Medicaid Other | Admitting: Neurology

## 2016-03-28 ENCOUNTER — Ambulatory Visit: Payer: Self-pay | Admitting: Neurology

## 2016-03-28 ENCOUNTER — Ambulatory Visit
Admission: RE | Admit: 2016-03-28 | Discharge: 2016-03-28 | Disposition: A | Discharge status: Home / Self Care | Payer: Medicaid Other | Source: Ambulatory Visit | Attending: Obstetrics & Gynecology | Admitting: Obstetrics & Gynecology

## 2016-03-28 ENCOUNTER — Encounter: Payer: Self-pay | Admitting: Neurology

## 2016-03-28 ENCOUNTER — Other Ambulatory Visit: Payer: Self-pay | Admitting: Neurology

## 2016-03-28 VITALS — BP 120/68 | HR 88 | Ht 61.0 in | Wt 243.5 lb

## 2016-03-28 DIAGNOSIS — G43119 Migraine with aura, intractable, without status migrainosus: Secondary | ICD-10-CM

## 2016-03-28 DIAGNOSIS — M797 Fibromyalgia: Secondary | ICD-10-CM

## 2016-03-28 DIAGNOSIS — Z1231 Encounter for screening mammogram for malignant neoplasm of breast: Secondary | ICD-10-CM

## 2016-03-28 HISTORY — DX: Migraine with aura, intractable, without status migrainosus: G43.119

## 2016-03-28 MED ORDER — PREGABALIN 25 MG PO CAPS: ORAL_CAPSULE | ORAL | Status: DC

## 2016-03-28 MED ORDER — TIZANIDINE HCL 4 MG PO TABS: 4.0000 mg | ORAL_TABLET | Freq: Four times a day (QID) | ORAL | Status: DC | PRN

## 2016-03-28 MED ORDER — LIDOCAINE 5 % EX PTCH: 1.0000 | MEDICATED_PATCH | CUTANEOUS | Status: DC
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
## 2016-03-31 ENCOUNTER — Encounter: Payer: Self-pay | Admitting: Physical Therapy

## 2016-03-31 ENCOUNTER — Ambulatory Visit: Payer: Medicaid Other | Attending: Neurology | Admitting: Physical Therapy

## 2016-03-31 DIAGNOSIS — M546 Pain in thoracic spine: Secondary | ICD-10-CM

## 2016-03-31 NOTE — Therapy (Signed)
[facility] [address] [place], [place], [address] Phone: [phone]   Fax:  [phone]  Patient Details  Name: [name] [name] MRN: [id number] Date of Birth: 15-Dec-1970 Referring Provider:  Kathrynn Ducking, MD  Encounter Date: 03/31/2016  PT notified pt that insurance would not cover additional PT visits, given diagnosis code. Pt requested defer PT evaluation at this time. Per pt questions, PT provided patient with list of approved diagnoses from Medicaid's web site.  Billie Ruddy, PT, DPT [facility] [address] [place], [place], [address] Phone: [phone]   Fax:  [phone] 03/31/2016, 4:27 PM

## 2016-04-01 ENCOUNTER — Telehealth: Payer: Self-pay | Admitting: Neurology

## 2016-04-01 DIAGNOSIS — G8929 Other chronic pain: Secondary | ICD-10-CM

## 2016-04-01 DIAGNOSIS — M545 Low back pain, unspecified: Secondary | ICD-10-CM

## 2016-04-01 NOTE — Telephone Encounter (Signed)
Patient called requesting to speak to nurse regarding physical therapy, "order needs to be fixed" and medication lidocaine (LIDODERM) 5 %, states medication needs PA.

## 2016-04-01 NOTE — Telephone Encounter (Signed)
PA in progress. Dr. Jannifer Franklin notified re: PT order request.

## 2016-04-01 NOTE — Telephone Encounter (Signed)
PA form signed and faxed to [facility] @ [phone]

## 2016-04-01 NOTE — Telephone Encounter (Signed)
PA form completed, awaiting signature.

## 2016-04-02 MED ORDER — DICLOFENAC SODIUM 1 % TD GEL: 1.0000 "application " | Freq: Four times a day (QID) | TRANSDERMAL | Status: DC | PRN

## 2016-04-02 NOTE — Telephone Encounter (Signed)
I called patient. The patient will only be allowed one session of physical therapy through Medicaid. This is likely not be enough to help her. I recommended that she contact a chiropractor. Patient once [place] to take over the diclofenac gel. I have sent in a prescription. The patient also would like a referral for breast reduction surgery, it is possible this may help some for back pain, I'm not sure this will be a cure for her issues completely, however. I will refer the patient to Dr. Towanda Malkin

## 2016-04-02 NOTE — Telephone Encounter (Signed)
Patient called back regarding orders for PT and lidocaine patch, states she "really, really, really needs to speak to nurse, pain is getting worse and worse and worse and worse".

## 2016-04-02 NOTE — Addendum Note (Signed)
Addended by: Margette Fast on: 04/02/2016 05:49 PM   Modules accepted: Orders

## 2016-04-02 NOTE — Telephone Encounter (Addendum)
Returned pt [name] [name]. Reports that PA was approved for Lidocaine patch. However, her pharmacy is out of stock and she is waiting on her daughter for transportation to get it filled elsewhere.   She is also asking if Dr. Jannifer Franklin would be willing to take over prescribing Voltaren gel (rather than PCP). Says that she has been using gel 4 x per day for back pain.  In addition, she brought in letter from PT re: approved diagnosis for therapy. Pt would like order for therapy to say "thoracic and lumbar spine" d/t her back pain. She would also like evaluation for breast reduction to assist in alleviation of pain.

## 2016-04-25 ENCOUNTER — Emergency Department (HOSPITAL_COMMUNITY): Payer: Medicaid Other

## 2016-04-25 ENCOUNTER — Encounter (HOSPITAL_COMMUNITY): Payer: Self-pay | Admitting: Emergency Medicine

## 2016-04-25 ENCOUNTER — Emergency Department (HOSPITAL_COMMUNITY)
Admission: EM | Admit: 2016-04-25 | Discharge: 2016-04-25 | Disposition: A | Discharge status: Home / Self Care | Payer: Medicaid Other | Attending: Emergency Medicine | Admitting: Emergency Medicine

## 2016-04-25 DIAGNOSIS — J45909 Unspecified asthma, uncomplicated: Secondary | ICD-10-CM | POA: Diagnosis not present

## 2016-04-25 DIAGNOSIS — Z79899 Other long term (current) drug therapy: Secondary | ICD-10-CM | POA: Insufficient documentation

## 2016-04-25 DIAGNOSIS — I10 Essential (primary) hypertension: Secondary | ICD-10-CM | POA: Insufficient documentation

## 2016-04-25 DIAGNOSIS — F17210 Nicotine dependence, cigarettes, uncomplicated: Secondary | ICD-10-CM | POA: Insufficient documentation

## 2016-04-25 DIAGNOSIS — I209 Angina pectoris, unspecified: Secondary | ICD-10-CM | POA: Diagnosis not present

## 2016-04-25 DIAGNOSIS — E119 Type 2 diabetes mellitus without complications: Secondary | ICD-10-CM | POA: Insufficient documentation

## 2016-04-25 DIAGNOSIS — Z7984 Long term (current) use of oral hypoglycemic drugs: Secondary | ICD-10-CM | POA: Diagnosis not present

## 2016-04-25 DIAGNOSIS — R072 Precordial pain: Secondary | ICD-10-CM | POA: Diagnosis present

## 2016-04-25 LAB — I-STAT TROPONIN, ED
Troponin i, poc: 0 ng/mL (ref 0.00–0.08)
Troponin i, poc: 0 ng/mL (ref 0.00–0.08)

## 2016-04-25 LAB — CBC
HCT: 41.7 % (ref 36.0–46.0)
Hemoglobin: 13.5 g/dL (ref 12.0–15.0)
MCH: 27.6 pg (ref 26.0–34.0)
MCHC: 32.4 g/dL (ref 30.0–36.0)
MCV: 85.3 fL (ref 78.0–100.0)
Platelets: 233 10*3/uL (ref 150–400)
RBC: 4.89 MIL/uL (ref 3.87–5.11)
RDW: 15.6 % — ABNORMAL HIGH (ref 11.5–15.5)
WBC: 9 10*3/uL (ref 4.0–10.5)

## 2016-04-25 LAB — BASIC METABOLIC PANEL
Anion gap: 10 (ref 5–15)
BUN: 17 mg/dL (ref 6–20)
CO2: 34 mmol/L — ABNORMAL HIGH (ref 22–32)
Calcium: 9.3 mg/dL (ref 8.9–10.3)
Chloride: 90 mmol/L — ABNORMAL LOW (ref 101–111)
Creatinine, Ser: 0.96 mg/dL (ref 0.44–1.00)
GFR calc Af Amer: >60 mL/min (ref >60)
GFR calc non Af Amer: >60 mL/min (ref >60)
Glucose, Bld: 160 mg/dL — ABNORMAL HIGH (ref 65–99)
Potassium: 3.3 mmol/L — ABNORMAL LOW (ref 3.5–5.1)
Sodium: 134 mmol/L — ABNORMAL LOW (ref 135–145)

## 2016-04-25 MED ORDER — NITROGLYCERIN 0.4 MG SL SUBL: 0.4000 mg | SUBLINGUAL_TABLET | SUBLINGUAL | Status: DC | PRN

## 2016-04-25 MED ORDER — PANTOPRAZOLE SODIUM 40 MG PO TBEC
40.0000 mg | DELAYED_RELEASE_TABLET | Freq: Once | ORAL | Status: AC
  Administered 2016-04-25: 40 mg via ORAL
  Filled 2016-04-25: qty 1

## 2016-04-25 MED ORDER — POTASSIUM CHLORIDE ER 10 MEQ PO TBCR
20.0000 meq | EXTENDED_RELEASE_TABLET | Freq: Once | ORAL | Status: AC
  Administered 2016-04-25: 20 meq via ORAL
  Filled 2016-04-25: qty 2

## 2016-04-25 NOTE — ED Notes (Signed)
Denies chest pain at this time.

## 2016-04-25 NOTE — Discharge Instructions (Signed)
We saw you in the ER for the chest pain/shortness of breath. All of our cardiac workup is normal, including labs, EKG and chest X-RAY are normal. We are not sure what is causing your discomfort but are concerned about your heart. We spoke with your cardiologist, who wanted to get 2 cardiac enzymes, which if normal, he will see you in the clinic on Monday.  Please return to the ER if you have worsening chest pain, shortness of breath, pain radiating to your jaw, shoulder, or back, sweats or fainting. Otherwise see the Cardiologist or your primary care doctor as requested.    Angina Pectoris Angina pectoris, often called angina, is extreme discomfort in the chest, neck, or arm. This is caused by a lack of blood in the middle and thickest layer of the heart wall (myocardium). There are four types of angina:  Stable angina. Stable angina usually occurs in episodes of predictable frequency and duration. It is usually brought on by physical activity, stress, or excitement. Stable angina usually lasts a few minutes and can often be relieved by a medicine that you place under your tongue. This medicine is called sublingual nitroglycerin.  Unstable angina. Unstable angina can occur even when you are doing little or no physical activity. It can even occur while you are sleeping or when you are at rest. It can suddenly increase in severity or frequency. It may not be relieved by sublingual nitroglycerin, and it can last up to 30 minutes.  Microvascular angina. This type of angina is caused by a disorder of tiny blood vessels called arterioles. Microvascular angina is more common in women. The pain may be more severe and last longer than other types of angina pectoris.  Prinzmetal or variant angina. This type of angina pectoris is rare and usually occurs when you are doing little or no physical activity. It especially occurs in the early morning hours. CAUSES Atherosclerosis is the cause of angina. This is  the buildup of fat and cholesterol (plaque) on the inside of the arteries. Over time, the plaque may narrow or block the artery, and this will lessen blood flow to the heart. Plaque can also become weak and break off within a coronary artery to form a clot and cause a sudden blockage. RISK FACTORS Risk factors common to both men and women include:  High cholesterol levels.  High blood pressure (hypertension).  Tobacco use.  Diabetes.  Family history of angina.  Obesity.  Lack of exercise.  A diet high in saturated fats. Women are at greater risk for angina if they are:  Over age 23.  Postmenopausal. SYMPTOMS Many people do not experience any symptoms during the early stages of angina. As the condition progresses, symptoms common to both men and women may include:  Chest pain.  The pain can be described as a crushing or squeezing in the chest, or a tightness, pressure, fullness, or heaviness in the chest.  The pain can last more than a few minutes, or it can stop and recur.  Pain in the arms, neck, jaw, or back.  Unexplained heartburn or indigestion.  Shortness of breath.  Nausea.  Sudden cold sweats.  Sudden light-headedness. Many women have chest discomfort and some of the other symptoms. However, women often have different (atypical) symptoms, such as:   Fatigue.  Unexplained feelings of nervousness or anxiety.  Unexplained weakness.  Dizziness or fainting. Sometimes, women may have angina without any symptoms. DIAGNOSIS  Tests to diagnose angina may include:  ECG (  electrocardiogram).  Exercise stress test. This looks for signs of blockage when the heart is being exercised.  Pharmacologic stress test. This test looks for signs of blockage when the heart is being stressed with a medicine.  Blood tests.  Coronary angiogram. This is a procedure to look at the coronary arteries to see if there is any blockage. TREATMENT  The treatment of angina may  include the following:  Healthy behavioral changes to reduce or control risk factors.  Medicine.  Coronary stenting.A stent helps to keep an artery open.  Coronary angioplasty. This procedure widens a narrowed or blocked artery.  Coronary arterybypass surgery. This will allow your blood to pass the blockage (bypass) to reach your heart. HOME CARE INSTRUCTIONS   Take medicines only as directed by your health care provider.  Do not take the following medicines unless your health care provider approves:  Nonsteroidal anti-inflammatory drugs (NSAIDs), such as ibuprofen, naproxen, or celecoxib.  Vitamin supplements that contain vitamin A, vitamin E, or both.  Hormone replacement therapy that contains estrogen with or without progestin.  Manage other health conditions such as hypertension and diabetes as directed by your health care provider.  Follow a heart-healthy diet. A dietitian can help to educate you about healthy food options and changes.  Use healthy cooking methods such as roasting, grilling, broiling, baking, poaching, steaming, or stir-frying. Talk to a dietitian to learn more about healthy cooking methods.  Follow an exercise program approved by your health care provider.  Maintain a healthy weight. Lose weight as approved by your health care provider.  Plan rest periods when fatigued.  Learn to manage stress.  Do not use any tobacco products, including cigarettes, chewing tobacco, or electronic cigarettes. If you need help quitting, ask your health care provider.  If you drink alcohol, and your health care provider approves, limit your alcohol intake to no more than 1 drink per day. One drink equals 12 ounces of beer, 5 ounces of wine, or 1 ounces of hard liquor.  Stop illegal drug use.  Keep all follow-up visits as directed by your health care provider. This is important. SEEK IMMEDIATE MEDICAL CARE IF:   You have pain in your chest, neck, arm, jaw, stomach,  or back that lasts more than a few minutes, is recurring, or is unrelieved by taking sublingualnitroglycerin.  You have profuse sweating without cause.  You have unexplained:  Heartburn or indigestion.  Shortness of breath or difficulty breathing.  Nausea or vomiting.  Fatigue.  Feelings of nervousness or anxiety.  Weakness.  Diarrhea.  You have sudden light-headedness or dizziness.  You faint. These symptoms may represent a serious problem that is an emergency. Do not wait to see if the symptoms will go away. Get medical help right away. Call your local emergency services (911 in the [place] Do not drive yourself to the hospital.   This information is not intended to replace advice given to you by your health care provider. Make sure you discuss any questions you have with your health care provider.   Document Released: 11/10/2005 Document Revised: 12/01/2014 Document Reviewed: 03/14/2014 Elsevier Interactive Patient Education [employer].

## 2016-04-25 NOTE — ED Notes (Signed)
Pt. Stated, "I just want to eat, I am starving."  Pt. Given a bag lunch and also her husband given a lunch.  Both given drinks

## 2016-04-25 NOTE — ED Notes (Signed)
Patient asking for [place] sandwich, chips, etc.   Patient trying to talk boyfriend into getting her food per NT.
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
## 2016-04-25 NOTE — ED Notes (Signed)
Per EMS, patient was at opthalmologists office this morning and went to the bathroom.  Patient started having chest pain there.   Patient took a NTG SL before EMS arrived.   Patient states resolved on the way here.  Patient refused ASA from EMS - allergic.   Patient  States no other symptoms at this time.    Patient states "I'm feeling better".   Patient had a 20 G placed in R hand by EMS.   Patient vs 112/70, 88 , 16, 96%RA,   [id number].

## 2016-05-08 ENCOUNTER — Encounter (HOSPITAL_COMMUNITY): Payer: Self-pay | Admitting: General Practice

## 2016-05-08 ENCOUNTER — Observation Stay (HOSPITAL_COMMUNITY): Payer: Medicaid Other

## 2016-05-08 ENCOUNTER — Observation Stay (HOSPITAL_COMMUNITY)
Admission: AD | Admit: 2016-05-08 | Discharge: 2016-05-09 | Disposition: A | Discharge status: Home / Self Care | Payer: Medicaid Other | Source: Ambulatory Visit | Attending: Cardiovascular Disease | Admitting: Cardiovascular Disease

## 2016-05-08 DIAGNOSIS — R631 Polydipsia: Secondary | ICD-10-CM | POA: Diagnosis not present

## 2016-05-08 DIAGNOSIS — I10 Essential (primary) hypertension: Secondary | ICD-10-CM | POA: Diagnosis not present

## 2016-05-08 DIAGNOSIS — Z6841 Body Mass Index (BMI) 40.0 and over, adult: Secondary | ICD-10-CM | POA: Diagnosis not present

## 2016-05-08 DIAGNOSIS — R079 Chest pain, unspecified: Secondary | ICD-10-CM | POA: Diagnosis present

## 2016-05-08 DIAGNOSIS — J449 Chronic obstructive pulmonary disease, unspecified: Secondary | ICD-10-CM | POA: Diagnosis not present

## 2016-05-08 DIAGNOSIS — Z7984 Long term (current) use of oral hypoglycemic drugs: Secondary | ICD-10-CM | POA: Insufficient documentation

## 2016-05-08 DIAGNOSIS — F411 Generalized anxiety disorder: Secondary | ICD-10-CM | POA: Insufficient documentation

## 2016-05-08 DIAGNOSIS — F319 Bipolar disorder, unspecified: Secondary | ICD-10-CM | POA: Diagnosis not present

## 2016-05-08 DIAGNOSIS — E119 Type 2 diabetes mellitus without complications: Secondary | ICD-10-CM | POA: Insufficient documentation

## 2016-05-08 DIAGNOSIS — E669 Obesity, unspecified: Secondary | ICD-10-CM | POA: Insufficient documentation

## 2016-05-08 DIAGNOSIS — K219 Gastro-esophageal reflux disease without esophagitis: Secondary | ICD-10-CM | POA: Insufficient documentation

## 2016-05-08 DIAGNOSIS — M797 Fibromyalgia: Secondary | ICD-10-CM | POA: Diagnosis present

## 2016-05-08 DIAGNOSIS — R0602 Shortness of breath: Secondary | ICD-10-CM | POA: Insufficient documentation

## 2016-05-08 DIAGNOSIS — R0789 Other chest pain: Principal | ICD-10-CM | POA: Insufficient documentation

## 2016-05-08 DIAGNOSIS — G4733 Obstructive sleep apnea (adult) (pediatric): Secondary | ICD-10-CM | POA: Diagnosis not present

## 2016-05-08 DIAGNOSIS — G8929 Other chronic pain: Secondary | ICD-10-CM | POA: Insufficient documentation

## 2016-05-08 DIAGNOSIS — M549 Dorsalgia, unspecified: Secondary | ICD-10-CM | POA: Diagnosis not present

## 2016-05-08 DIAGNOSIS — Z9119 Patient's noncompliance with other medical treatment and regimen: Secondary | ICD-10-CM | POA: Diagnosis not present

## 2016-05-08 DIAGNOSIS — G43009 Migraine without aura, not intractable, without status migrainosus: Secondary | ICD-10-CM | POA: Diagnosis not present

## 2016-05-08 DIAGNOSIS — F410 Panic disorder [episodic paroxysmal anxiety] without agoraphobia: Secondary | ICD-10-CM | POA: Diagnosis not present

## 2016-05-08 DIAGNOSIS — Z79899 Other long term (current) drug therapy: Secondary | ICD-10-CM | POA: Insufficient documentation

## 2016-05-08 DIAGNOSIS — I509 Heart failure, unspecified: Secondary | ICD-10-CM

## 2016-05-08 DIAGNOSIS — R600 Localized edema: Secondary | ICD-10-CM | POA: Insufficient documentation

## 2016-05-08 HISTORY — DX: Bipolar disorder, unspecified: F31.9

## 2016-05-08 HISTORY — DX: Headache, unspecified: R51.9

## 2016-05-08 HISTORY — DX: Personal history of other medical treatment: Z92.89

## 2016-05-08 HISTORY — DX: Type 2 diabetes mellitus without complications: E11.9

## 2016-05-08 HISTORY — DX: Headache: R51

## 2016-05-08 LAB — CBC WITH DIFFERENTIAL/PLATELET
Basophils Absolute: 0 10*3/uL (ref 0.0–0.1)
Basophils Relative: 0 %
Eosinophils Absolute: 0.1 10*3/uL (ref 0.0–0.7)
Eosinophils Relative: 2 %
HCT: 39.6 % (ref 36.0–46.0)
Hemoglobin: 12.5 g/dL (ref 12.0–15.0)
Lymphocytes Relative: 42 %
Lymphs Abs: 3.2 10*3/uL (ref 0.7–4.0)
MCH: 27.4 pg (ref 26.0–34.0)
MCHC: 31.6 g/dL (ref 30.0–36.0)
MCV: 86.8 fL (ref 78.0–100.0)
Monocytes Absolute: 1 10*3/uL (ref 0.1–1.0)
Monocytes Relative: 14 %
Neutro Abs: 3.2 10*3/uL (ref 1.7–7.7)
Neutrophils Relative %: 42 %
Platelets: 220 10*3/uL (ref 150–400)
RBC: 4.56 MIL/uL (ref 3.87–5.11)
RDW: 16.1 % — ABNORMAL HIGH (ref 11.5–15.5)
WBC: 7.6 10*3/uL (ref 4.0–10.5)

## 2016-05-08 LAB — COMPREHENSIVE METABOLIC PANEL
ALT: 25 U/L (ref 14–54)
AST: 20 U/L (ref 15–41)
Albumin: 3.7 g/dL (ref 3.5–5.0)
Alkaline Phosphatase: 69 U/L (ref 38–126)
Anion gap: 8 (ref 5–15)
BUN: 14 mg/dL (ref 6–20)
CO2: 32 mmol/L (ref 22–32)
Calcium: 9.3 mg/dL (ref 8.9–10.3)
Chloride: 97 mmol/L — ABNORMAL LOW (ref 101–111)
Creatinine, Ser: 0.98 mg/dL (ref 0.44–1.00)
GFR calc Af Amer: >60 mL/min (ref >60)
GFR calc non Af Amer: >60 mL/min (ref >60)
Glucose, Bld: 110 mg/dL — ABNORMAL HIGH (ref 65–99)
Potassium: 3.7 mmol/L (ref 3.5–5.1)
Sodium: 137 mmol/L (ref 135–145)
Total Bilirubin: 0.7 mg/dL (ref 0.3–1.2)
Total Protein: 6.5 g/dL (ref 6.5–8.1)

## 2016-05-08 LAB — TROPONIN I: Troponin I: <0.03 ng/mL (ref <0.031)

## 2016-05-08 LAB — BRAIN NATRIURETIC PEPTIDE: B Natriuretic Peptide: 10.7 pg/mL (ref 0.0–100.0)

## 2016-05-08 MED ORDER — HYPROMELLOSE (GONIOSCOPIC) 2.5 % OP SOLN
1.0000 [drp] | Freq: Four times a day (QID) | OPHTHALMIC | Status: DC
  Filled 2016-05-08: qty 15

## 2016-05-08 MED ORDER — ONDANSETRON HCL 4 MG/2ML IJ SOLN: 4.0000 mg | Freq: Four times a day (QID) | INTRAMUSCULAR | Status: DC | PRN

## 2016-05-08 MED ORDER — OXYCODONE HCL 5 MG PO TABS
5.0000 mg | ORAL_TABLET | Freq: Three times a day (TID) | ORAL | Status: DC | PRN
  Administered 2016-05-09: 5 mg via ORAL
  Filled 2016-05-08: qty 1

## 2016-05-08 MED ORDER — FLUOROMETHOLONE 0.1 % OP SUSP
1.0000 [drp] | Freq: Four times a day (QID) | OPHTHALMIC | Status: DC
  Filled 2016-05-08: qty 5

## 2016-05-08 MED ORDER — METFORMIN HCL 500 MG PO TABS: 500.0000 mg | ORAL_TABLET | Freq: Two times a day (BID) | ORAL | Status: DC

## 2016-05-08 MED ORDER — PROMETHAZINE HCL 25 MG PO TABS: 25.0000 mg | ORAL_TABLET | Freq: Four times a day (QID) | ORAL | Status: DC | PRN

## 2016-05-08 MED ORDER — ALBUTEROL SULFATE (2.5 MG/3ML) 0.083% IN NEBU: 2.5000 mg | INHALATION_SOLUTION | Freq: Four times a day (QID) | RESPIRATORY_TRACT | Status: DC | PRN

## 2016-05-08 MED ORDER — HEPARIN SODIUM (PORCINE) 5000 UNIT/ML IJ SOLN
5000.0000 [IU] | Freq: Three times a day (TID) | INTRAMUSCULAR | Status: DC
  Administered 2016-05-08 – 2016-05-09 (×3): 5000 [IU] via SUBCUTANEOUS
  Filled 2016-05-08 (×3): qty 1

## 2016-05-08 MED ORDER — POTASSIUM CHLORIDE CRYS ER 10 MEQ PO TBCR
10.0000 meq | EXTENDED_RELEASE_TABLET | Freq: Three times a day (TID) | ORAL | Status: DC
  Administered 2016-05-08 – 2016-05-09 (×2): 10 meq via ORAL
  Filled 2016-05-08 (×2): qty 1

## 2016-05-08 MED ORDER — NICOTINE 21 MG/24HR TD PT24
21.0000 mg | MEDICATED_PATCH | Freq: Every day | TRANSDERMAL | Status: DC
  Administered 2016-05-08 – 2016-05-09 (×2): 21 mg via TRANSDERMAL
  Filled 2016-05-08: qty 1

## 2016-05-08 MED ORDER — SODIUM CHLORIDE 0.9 % IV SOLN: 250.0000 mL | INTRAVENOUS | Status: DC | PRN

## 2016-05-08 MED ORDER — NYSTATIN 100000 UNIT/ML MT SUSP: 500000.0000 [IU] | Freq: Four times a day (QID) | OROMUCOSAL | Status: DC | PRN

## 2016-05-08 MED ORDER — FAMOTIDINE 20 MG PO TABS
20.0000 mg | ORAL_TABLET | Freq: Every day | ORAL | Status: DC
  Administered 2016-05-09: 20 mg via ORAL
  Filled 2016-05-08: qty 1

## 2016-05-08 MED ORDER — KETOTIFEN FUMARATE 0.025 % OP SOLN
1.0000 [drp] | Freq: Four times a day (QID) | OPHTHALMIC | Status: DC
  Filled 2016-05-08: qty 5

## 2016-05-08 MED ORDER — PRAVASTATIN SODIUM 40 MG PO TABS: 40.0000 mg | ORAL_TABLET | Freq: Every evening | ORAL | Status: DC

## 2016-05-08 MED ORDER — ACETAMINOPHEN 325 MG PO TABS
650.0000 mg | ORAL_TABLET | ORAL | Status: DC | PRN
  Administered 2016-05-08 – 2016-05-09 (×2): 650 mg via ORAL
  Filled 2016-05-08 (×2): qty 2

## 2016-05-08 MED ORDER — PREGABALIN 75 MG PO CAPS
75.0000 mg | ORAL_CAPSULE | Freq: Every day | ORAL | Status: DC
  Administered 2016-05-08: 75 mg via ORAL
  Filled 2016-05-08: qty 1

## 2016-05-08 MED ORDER — NICOTINE 21 MG/24HR TD PT24
21.0000 mg | MEDICATED_PATCH | Freq: Every day | TRANSDERMAL | Status: DC
  Filled 2016-05-08: qty 1

## 2016-05-08 MED ORDER — SODIUM CHLORIDE 0.9% FLUSH
3.0000 mL | Freq: Two times a day (BID) | INTRAVENOUS | Status: DC
  Administered 2016-05-08: 3 mL via INTRAVENOUS

## 2016-05-08 MED ORDER — FUROSEMIDE 10 MG/ML IJ SOLN
40.0000 mg | Freq: Two times a day (BID) | INTRAMUSCULAR | Status: DC
  Administered 2016-05-08 – 2016-05-09 (×2): 40 mg via INTRAVENOUS
  Filled 2016-05-08 (×2): qty 4

## 2016-05-08 MED ORDER — DIAZEPAM 5 MG PO TABS: 5.0000 mg | ORAL_TABLET | Freq: Every day | ORAL | Status: DC | PRN

## 2016-05-08 MED ORDER — LOSARTAN POTASSIUM 50 MG PO TABS: 50.0000 mg | ORAL_TABLET | Freq: Every evening | ORAL | Status: DC

## 2016-05-08 MED ORDER — AMLODIPINE BESYLATE 5 MG PO TABS
5.0000 mg | ORAL_TABLET | Freq: Every day | ORAL | Status: DC
  Administered 2016-05-09: 5 mg via ORAL
  Filled 2016-05-08: qty 1

## 2016-05-08 MED ORDER — TIZANIDINE HCL 4 MG PO TABS
4.0000 mg | ORAL_TABLET | Freq: Four times a day (QID) | ORAL | Status: DC | PRN
  Administered 2016-05-08: 4 mg via ORAL
  Filled 2016-05-08 (×2): qty 1

## 2016-05-08 MED ORDER — MOMETASONE FURO-FORMOTEROL FUM 200-5 MCG/ACT IN AERO
2.0000 | INHALATION_SPRAY | Freq: Two times a day (BID) | RESPIRATORY_TRACT | Status: DC
  Filled 2016-05-08: qty 8.8

## 2016-05-08 MED ORDER — SODIUM CHLORIDE 0.9% FLUSH: 3.0000 mL | INTRAVENOUS | Status: DC | PRN

## 2016-05-08 MED ORDER — CARVEDILOL 6.25 MG PO TABS
6.2500 mg | ORAL_TABLET | Freq: Two times a day (BID) | ORAL | Status: DC
  Administered 2016-05-09: 6.25 mg via ORAL
  Filled 2016-05-08: qty 1

## 2016-05-08 MED ORDER — MEGESTROL ACETATE 40 MG PO TABS
80.0000 mg | ORAL_TABLET | Freq: Two times a day (BID) | ORAL | Status: DC
  Filled 2016-05-08: qty 2

## 2016-05-08 MED ORDER — ALPRAZOLAM 0.5 MG PO TABS
1.0000 mg | ORAL_TABLET | Freq: Every evening | ORAL | Status: DC | PRN
  Administered 2016-05-08: 1 mg via ORAL
  Filled 2016-05-08: qty 2
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
## 2016-05-09 ENCOUNTER — Encounter (HOSPITAL_COMMUNITY): Payer: Self-pay | Admitting: Cardiovascular Disease

## 2016-05-09 ENCOUNTER — Encounter (HOSPITAL_COMMUNITY)
Admission: AD | Disposition: A | Discharge status: Home / Self Care | Payer: Self-pay | Source: Ambulatory Visit | Attending: Cardiovascular Disease

## 2016-05-09 DIAGNOSIS — R0789 Other chest pain: Secondary | ICD-10-CM | POA: Diagnosis not present

## 2016-05-09 HISTORY — PX: CARDIAC CATHETERIZATION: SHX172

## 2016-05-09 LAB — TROPONIN I
Troponin I: <0.03 ng/mL (ref <0.031)
Troponin I: <0.03 ng/mL (ref <0.031)

## 2016-05-09 LAB — BASIC METABOLIC PANEL
Anion gap: 6 (ref 5–15)
BUN: 16 mg/dL (ref 6–20)
CO2: 32 mmol/L (ref 22–32)
Calcium: 8.8 mg/dL — ABNORMAL LOW (ref 8.9–10.3)
Chloride: 99 mmol/L — ABNORMAL LOW (ref 101–111)
Creatinine, Ser: 0.88 mg/dL (ref 0.44–1.00)
GFR calc Af Amer: >60 mL/min (ref >60)
GFR calc non Af Amer: >60 mL/min (ref >60)
Glucose, Bld: 122 mg/dL — ABNORMAL HIGH (ref 65–99)
Potassium: 3.9 mmol/L (ref 3.5–5.1)
Sodium: 137 mmol/L (ref 135–145)

## 2016-05-09 LAB — GLUCOSE, CAPILLARY: Glucose-Capillary: 126 mg/dL — ABNORMAL HIGH (ref 65–99)

## 2016-05-09 LAB — PROTIME-INR
INR: 1 (ref 0.00–1.49)
Prothrombin Time: 13.4 seconds (ref 11.6–15.2)

## 2016-05-09 SURGERY — LEFT HEART CATH AND CORONARY ANGIOGRAPHY

## 2016-05-09 MED ORDER — SODIUM CHLORIDE 0.9% FLUSH: 3.0000 mL | Freq: Two times a day (BID) | INTRAVENOUS | Status: DC

## 2016-05-09 MED ORDER — MIDAZOLAM HCL 2 MG/2ML IJ SOLN
INTRAMUSCULAR | Status: AC
  Filled 2016-05-09: qty 2

## 2016-05-09 MED ORDER — SODIUM CHLORIDE 0.9 % IV SOLN
INTRAVENOUS | Status: DC
  Administered 2016-05-09: 08:00:00 via INTRAVENOUS

## 2016-05-09 MED ORDER — SODIUM CHLORIDE 0.9 % IV SOLN: 250.0000 mL | INTRAVENOUS | Status: DC | PRN

## 2016-05-09 MED ORDER — IOPAMIDOL (ISOVUE-370) INJECTION 76%
INTRAVENOUS | Status: AC
  Filled 2016-05-09: qty 100

## 2016-05-09 MED ORDER — SODIUM CHLORIDE 0.9% FLUSH: 3.0000 mL | INTRAVENOUS | Status: DC | PRN

## 2016-05-09 MED ORDER — HEPARIN (PORCINE) IN NACL 2-0.9 UNIT/ML-% IJ SOLN
INTRAMUSCULAR | Status: AC
  Filled 2016-05-09: qty 1000

## 2016-05-09 MED ORDER — LIDOCAINE HCL (PF) 1 % IJ SOLN
INTRAMUSCULAR | Status: DC | PRN
  Administered 2016-05-09: 20 mL

## 2016-05-09 MED ORDER — MIDAZOLAM HCL 2 MG/2ML IJ SOLN
INTRAMUSCULAR | Status: DC | PRN
  Administered 2016-05-09 (×2): 1 mg via INTRAVENOUS

## 2016-05-09 MED ORDER — LIDOCAINE HCL (PF) 1 % IJ SOLN
INTRAMUSCULAR | Status: AC
  Filled 2016-05-09: qty 30

## 2016-05-09 MED ORDER — SODIUM CHLORIDE 0.9 % IV SOLN: INTRAVENOUS | Status: AC

## 2016-05-09 MED ORDER — FENTANYL CITRATE (PF) 100 MCG/2ML IJ SOLN
INTRAMUSCULAR | Status: DC | PRN
  Administered 2016-05-09: 25 ug via INTRAVENOUS

## 2016-05-09 MED ORDER — FENTANYL CITRATE (PF) 100 MCG/2ML IJ SOLN
INTRAMUSCULAR | Status: AC
  Filled 2016-05-09: qty 2

## 2016-05-09 MED ORDER — IOPAMIDOL (ISOVUE-370) INJECTION 76%
INTRAVENOUS | Status: DC | PRN
  Administered 2016-05-09: 50 mL via INTRA_ARTERIAL

## 2016-05-09 SURGICAL SUPPLY — 10 items
CATH INFINITI 5FR MULTPACK ANG (CATHETERS) ×2 IMPLANT
CATH SITESEER 5F NTR (CATHETERS) ×2 IMPLANT
HOVERMATT SINGLE USE (MISCELLANEOUS) ×2 IMPLANT
KIT HEART LEFT (KITS) ×3 IMPLANT
NEEDLE SMART REG 18GX2-3/4 (NEEDLE) ×3 IMPLANT
PACK CARDIAC CATHETERIZATION (CUSTOM PROCEDURE TRAY) ×3 IMPLANT
SHEATH PINNACLE 5F 10CM (SHEATH) ×2 IMPLANT
SYR MEDRAD MARK V 150ML (SYRINGE) ×3 IMPLANT
TRANSDUCER W/STOPCOCK (MISCELLANEOUS) ×3 IMPLANT
WIRE EMERALD 3MM-J .035X150CM (WIRE) ×2 IMPLANT

## 2016-05-09 NOTE — Progress Notes (Signed)
[name] [name] to be D/C'd Home per MD order. Discussed with the patient and all questions fully answered.    VVS, Skin clean, dry and intact without evidence of skin break down, no evidence of skin tears noted.  IV catheter discontinued intact. Site without signs and symptoms of complications. Dressing and pressure applied.  An After Visit Summary was printed and given to the patient.  Patient ambulatory, and D/C home via private auto.  Cyndra Numbers  05/09/2016 5:29 PM

## 2016-05-09 NOTE — Progress Notes (Signed)
Site area: Right groin a 5 french arterial sheath was removed  Site Prior to Removal:  Level 0  Pressure Applied For 15 MINUTES    Bedrest Beginning at 0930am  Manual:   Yes.    Patient Status During Pull:  stable  Post Pull Groin Site:  Level 0  Post Pull Instructions Given:  Yes.    Post Pull Pulses Present:  Yes.    Dressing Applied:  Yes.    Comments:  VS remain stable during sheath pull

## 2016-05-09 NOTE — Discharge Summary (Signed)
Physician Discharge Summary  Patient ID: [name] [name] MRN: [id number] DOB/AGE: [birth year] 45 y.o.  Admit date: 05/08/2016 Discharge date: 05/09/2016  Admission Diagnoses: Shortness of breath Bilateral leg edema R/O CHF Fibromyalgia Obesity Hypertension Tobacco use disorder Marijuana use disorder Back pain  Discharge Diagnoses:  Principle diagnosis: *Non-cardiac chest pain* Active Problems:   Shortness of breath on exertion   Bilateral leg edema   Polydipsia    Fibromyalgia   Chronic back pain   Migraine without aura   Obesity   DM, II   Hypertension   Tobacco use disorder   Marijuana use disorder   Generalized anxiety disorder   Panic disorder   Chronic depression and bipolar disorder  Discharged Condition: fair  Hospital Course: 45 year old female past history of chronic back pain with lumbar disc disorder, Chronic obstructive lung disease, Fibromyalgia and chronic use of pain medications presents with recurrent shortness of breath and leg swelling poorly responding to oral lasix. Patient admits to non-compliance on fluid intake saying she always drank lot of fluids. She also has chest pains and panic attacks. She had fair diuresis with lasix dose and mild to moderate reduction in leg edema. She was advised to continue efforts to decrease salt from the diet and decrease fluids by 25 %, elevate leg when possible and wear support stockings as needed during day time. She will see her pain management doctor and neurology doctor in 1 month and see me as needed only.  Consults: cardiology  Significant Diagnostic Studies: labs: Normal CBC, CMET except glucose of 110 to 122 mg/dL  EKG-NSR.  Chest x-ray : Essentially unremarkable/Stable.  Left heart catheterization: Normal coronaries. EF low normal at 50-55 %.  Treatments: cardiac meds: carvedilol, amlodipine, potassium, pravastatin and furosemide  Discharge Exam: Blood pressure 104/64, pulse 75, temperature 98 F  (36.7 C), temperature source Oral, resp. rate 16, height 5\' 2"  (1.575 m), weight 114.624 kg (252 lb 11.2 oz), last menstrual period 03/05/2016, SpO2 96 %. Constitutional: She appears well-developed and well-nourished. No distress.  HENT: Normocephalic and atraumatic. Oropharynx is clear and moist. No oropharyngeal exudate. Eyes: Conjunctivae and EOM are normal. Pupils are equal, round, and reactive to light.  Neck: 75 % range of motion. Neck supple.  Cardiovascular: Normal rate, regular rhythm, normal heart sounds and intact distal pulses.II/VI systolic murmur heard. Pulmonary/Chest: No distress at this time. Breath sounds are normal.  Abdominal: Soft. There is epigastirc tenderness. There is no rebound and no guarding.  Musculoskeletal: Normal range of motion. She exhibits 1 + edema.  Neurological: She is alert and oriented to person, place, and time. No cranial nerve deficit. She exhibits normal muscle tone. Coordination normal. No ataxia on finger to nose bilaterally. No pronator drift. 5/5 strength throughout. CN 2-12 intact. Negative Romberg. Equal grip strength. Sensation intact. Gait is normal.Mid and low back tenderness on palpation  Skin: Skin is warm.  Psychiatric: She appears anxious.  Nursing note and vitals reviewed.  Disposition: 01-Home or Self Care     Medication List    STOP taking these medications        oxyCODONE 5 MG immediate release tablet  Commonly known as:  Oxy IR/ROXICODONE      TAKE these medications        acetaminophen 500 MG tablet  Commonly known as:  TYLENOL  Take 500 mg by mouth every 6 (six) hours as needed for moderate pain.     albuterol 108 (90 Base) MCG/ACT inhaler  Commonly known as:  PROVENTIL HFA;VENTOLIN HFA  Inhale 2 puffs into the lungs every 6 (six) hours as needed. For shortness of breath     ALPRAZolam 1 MG tablet  Commonly known as:  XANAX  Take 1 mg by mouth 3 (three) times daily as needed for anxiety or sleep.      amLODipine 5 MG tablet  Commonly known as:  NORVASC  Take 5 mg by mouth daily.     budesonide-formoterol 160-4.5 MCG/ACT inhaler  Commonly known as:  SYMBICORT  Inhale 2 puffs into the lungs 2 (two) times daily as needed (for shortness of breath).     buprenorphine 8 MG Subl SL tablet  Commonly known as:  SUBUTEX  Place 8 mg under the tongue 3 (three) times daily.     Calcium Carb-Cholecalciferol 600-800 MG-UNIT Tabs  Take 1 tablet by mouth daily.     carvedilol 6.25 MG tablet  Commonly known as:  COREG  Take 6.25 mg by mouth 2 (two) times daily with a meal.     diazepam 5 MG tablet  Commonly known as:  VALIUM  Take 5 mg by mouth daily as needed for anxiety.     diclofenac sodium 1 % Gel  Commonly known as:  VOLTAREN  Apply 1 application topically 4 (four) times daily as needed (for pain). For pain     fluorometholone 0.1 % ophthalmic suspension  Commonly known as:  FML  Place 1 drop into both eyes 4 (four) times daily. Patient states she started this medication on April 31st. Take for 8 weeks only     furosemide 20 MG tablet  Commonly known as:  LASIX  Take 20 mg by mouth 2 (two) times daily.     hydroxypropyl methylcellulose / hypromellose 2.5 % ophthalmic solution  Commonly known as:  ISOPTO TEARS / GONIOVISC  Place 1 drop into both eyes 4 (four) times daily.     ketotifen 0.025 % ophthalmic solution  Commonly known as:  ZADITOR  Place 1 drop into both eyes 4 (four) times daily.     lidocaine 5 %  Commonly known as:  LIDODERM  Place 1 patch onto the skin daily. Remove & Discard patch within 12 hours or as directed by MD     losartan 50 MG tablet  Commonly known as:  COZAAR  Take 1 tablet (50 mg total) by mouth daily.     megestrol 40 MG tablet  Commonly known as:  MEGACE  Take 2 tablets (80 mg total) by mouth 2 (two) times daily. Can increase to two tablets twice a day in the event of heavy bleeding     metFORMIN 500 MG tablet  Commonly known as:  GLUCOPHAGE   Take 1 tablet (500 mg total) by mouth 2 (two) times daily with a meal.     multivitamin with minerals Tabs tablet  Take 1 tablet by mouth daily.     mupirocin ointment 2 %  Commonly known as:  BACTROBAN  Apply 1 application topically as needed (for bumps).     nitroGLYCERIN 0.4 MG SL tablet  Commonly known as:  NITROSTAT  Place 0.4 mg under the tongue every 5 (five) minutes as needed for chest pain.     nystatin 100000 UNIT/ML suspension  Commonly known as:  MYCOSTATIN  Take 500,000 Units by mouth 4 (four) times daily as needed (for oral swelling).     potassium chloride 10 MEQ CR capsule  Commonly known as:  MICRO-K  Take 10 mEq by mouth  daily.     pravastatin 40 MG tablet  Commonly known as:  PRAVACHOL  Take 40 mg by mouth every evening.     pregabalin 25 MG capsule  Commonly known as:  LYRICA  One capsule in the morning and midday, 3 capsules at night     promethazine 25 MG tablet  Commonly known as:  PHENERGAN  Take 25 mg by mouth every 6 (six) hours as needed for nausea or vomiting.     Propylene Glycol 0.6 % Soln  Place 1 drop into both eyes 4 (four) times daily.     ranitidine 300 MG tablet  Commonly known as:  ZANTAC  Take 300 mg by mouth 2 (two) times daily.     SUMAtriptan 5 MG/ACT nasal spray  Commonly known as:  IMITREX  Place 1 spray (5 mg total) into the nose 2 (two) times daily as needed for migraine.     tiZANidine 4 MG tablet  Commonly known as:  ZANAFLEX  Take 1 tablet (4 mg total) by mouth every 6 (six) hours as needed for muscle spasms.     vitamin B-12 1000 MCG tablet  Commonly known as:  CYANOCOBALAMIN  Take 1,000 mcg by mouth 2 (two) times daily.           Follow-up Information    Follow up with [facility] S, MD. Schedule an appointment as soon as possible for a visit in 2 weeks.   Specialty:  Cardiology   Contact information:   [place] [place] [id number] [phone]       Follow up with Lenor Coffin,  MD. Schedule an appointment as soon as possible for a visit in 1 month.   Specialty:  Neurology   Why:  As arranged   Contact information:   [address] [place] [address] [phone]       Signed: Birdie Riddle 05/09/2016, 3:55 PM

## 2016-05-09 NOTE — Progress Notes (Signed)
   05/09/16 0935  Clinical Encounter Type  Visited With Patient not available;Health care provider  Visit Type Initial;Post-op  Referral From Nurse;Patient   Chaplain attempted to meet with patient. Patient is resting and recovering after surgery this morning. Spiritual care services will seek to follow-up later on today. Spiritual care services available as needed.    Jeri Lager, Chaplain 05/09/2016 9:36 AM

## 2016-05-09 NOTE — Interval H&P Note (Signed)
History and Physical Interval Note:  05/09/2016 8:08 AM  [name] [name]  has presented today for surgery, with the diagnosis of cp  The various methods of treatment have been discussed with the patient and family. After consideration of risks, benefits and other options for treatment, the patient has consented to  Procedure(s): Left Heart Cath and Coronary Angiography (N/A) as a surgical intervention .  The patient's history has been reviewed, patient examined, no change in status, stable for surgery.  I have reviewed the patient's chart and labs.  Questions were answered to the patient's satisfaction.     [name]

## 2016-05-28 ENCOUNTER — Encounter (HOSPITAL_COMMUNITY): Admission: RE | Payer: Self-pay | Source: Ambulatory Visit

## 2016-05-28 ENCOUNTER — Ambulatory Visit (HOSPITAL_COMMUNITY): Admission: RE | Admit: 2016-05-28 | Payer: Medicaid Other | Source: Ambulatory Visit | Admitting: Gastroenterology

## 2016-05-28 SURGERY — MANOMETRY, ANORECTAL

## 2016-06-02 ENCOUNTER — Telehealth: Payer: Self-pay | Admitting: Adult Health

## 2016-06-02 ENCOUNTER — Encounter: Payer: Self-pay | Admitting: Adult Health

## 2016-06-02 ENCOUNTER — Ambulatory Visit (INDEPENDENT_AMBULATORY_CARE_PROVIDER_SITE_OTHER): Payer: Medicaid Other | Admitting: Adult Health

## 2016-06-02 VITALS — BP 132/70 | HR 87 | Ht 62.0 in | Wt 259.6 lb

## 2016-06-02 DIAGNOSIS — M797 Fibromyalgia: Secondary | ICD-10-CM

## 2016-06-02 DIAGNOSIS — M546 Pain in thoracic spine: Secondary | ICD-10-CM | POA: Diagnosis not present

## 2016-06-02 DIAGNOSIS — G894 Chronic pain syndrome: Secondary | ICD-10-CM

## 2016-06-02 DIAGNOSIS — E669 Obesity, unspecified: Secondary | ICD-10-CM | POA: Diagnosis not present

## 2016-06-02 MED ORDER — PREGABALIN 25 MG PO CAPS: ORAL_CAPSULE | ORAL | Status: DC

## 2016-06-02 MED ORDER — TIZANIDINE HCL 4 MG PO TABS: 4.0000 mg | ORAL_TABLET | Freq: Four times a day (QID) | ORAL | Status: DC | PRN
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
## 2016-06-02 NOTE — Patient Instructions (Signed)
Continue lyrica- may need to wean off if swelling continues MRI thoracic spine Referral to pain clinic If your symptoms worsen or you develop new symptoms please let us know.

## 2016-06-02 NOTE — Progress Notes (Signed)
I have read the note, and I agree with the clinical assessment and plan.  Shakeita Vandevander KEITH   

## 2016-06-02 NOTE — Progress Notes (Signed)
Fax confirmation received.  [phone] lyrica.

## 2016-06-02 NOTE — Progress Notes (Signed)
Faxed ofv note to Dr. Alphonzo Grieve, at [facility] clinic.  [phone].

## 2016-06-02 NOTE — Telephone Encounter (Signed)
Dr. Lillia Corporal,  [facility].  I will fax last ovf note.   Done.

## 2016-06-02 NOTE — Telephone Encounter (Signed)
Pt called to make sure [facility] know her PCP is now Dr Everlean Alstrom. She would like some notes sent to him to familiarize him with her care at [facility]

## 2016-06-03 ENCOUNTER — Telehealth: Payer: Self-pay | Admitting: Adult Health

## 2016-06-03 MED ORDER — TIZANIDINE HCL 4 MG PO TABS: 4.0000 mg | ORAL_TABLET | Freq: Four times a day (QID) | ORAL | Status: DC | PRN

## 2016-06-03 NOTE — Telephone Encounter (Signed)
If the Zanaflex helps, okay to take every 6 hours, this is well within the prescribing dosage of the medication.

## 2016-06-03 NOTE — Telephone Encounter (Signed)
I spoke to pt. Pt says that she takes her zanaflex 4mg  every 6 hours because she is "hurting so bad all the time." She does not use it PRN as instructed. She wants more than 30 tablets dispensed because she takes it every 6 hours and this will only last for one week. It does not appear that our office has ever written for the zanaflex to be taken four times daily (It was once written in the past to be taken three times daily by Dr. Jannifer Franklin). Pt also wants enough refills to last her 6 months. Pt is asking that I send this message only to Dr. Jannifer Franklin because "Dr. Jannifer Franklin understands this because this is what he told me he was going to do" and not to send it to [facility], NP because "I'm not sure she understood me."  Dr. Jannifer Franklin, are you agreeable to changing the zanaflex RX for pt to take it every 6 hours (not PRN) and give 120 tablets to last her 30 days?

## 2016-06-03 NOTE — Telephone Encounter (Signed)
I called pt to discuss. It does not appear that our office has ever written for the zanaflex to be taken every 6 hours. Dr. Jannifer Franklin last wrote the RX on 03/28/16 for q6hprn and gave 30 tablets.  No answer, and no VM set up. Will try again later.

## 2016-06-03 NOTE — Telephone Encounter (Signed)
Pt called sts when she picked up  tiZANidine (ZANAFLEX) 4 MG tablet the RX is for 1 week supply, she takes it every 6 hours. Please send new RX with correct qty to [facility]. She also request 6 mth supply to last until she comes in January 2018.

## 2016-06-04 ENCOUNTER — Telehealth: Payer: Self-pay | Admitting: Adult Health

## 2016-06-04 NOTE — Telephone Encounter (Signed)
Pt called wanting to be referred to [facility]. [id number] address: [address] [phone]. She said her PCP suggested this clinic.

## 2016-06-05 ENCOUNTER — Institutional Professional Consult (permissible substitution): Payer: Medicaid Other | Admitting: Internal Medicine

## 2016-06-05 ENCOUNTER — Telehealth: Payer: Self-pay | Admitting: *Deleted

## 2016-06-05 NOTE — Telephone Encounter (Signed)
I called pt unable to leave a message voice mail not set up.

## 2016-06-06 ENCOUNTER — Encounter: Payer: Self-pay | Admitting: *Deleted

## 2016-06-06 NOTE — Addendum Note (Signed)
Addended byOliver Hum on: 06/06/2016 08:37 AM   Modules accepted: Medications

## 2016-06-12 ENCOUNTER — Ambulatory Visit
Admission: RE | Admit: 2016-06-12 | Discharge: 2016-06-12 | Disposition: A | Discharge status: Home / Self Care | Payer: Medicaid Other | Source: Ambulatory Visit | Attending: Adult Health | Admitting: Adult Health

## 2016-06-12 DIAGNOSIS — M546 Pain in thoracic spine: Secondary | ICD-10-CM

## 2016-06-13 ENCOUNTER — Institutional Professional Consult (permissible substitution): Payer: Medicaid Other | Admitting: Internal Medicine

## 2016-06-16 ENCOUNTER — Telehealth: Payer: Self-pay | Admitting: Neurology

## 2016-06-16 NOTE — Telephone Encounter (Signed)
Pt called requesting to speak with RN regarding medication. Please call

## 2016-06-17 ENCOUNTER — Telehealth: Payer: Self-pay | Admitting: Neurology

## 2016-06-17 MED ORDER — TIZANIDINE HCL 4 MG PO TABS: 4.0000 mg | ORAL_TABLET | Freq: Four times a day (QID) | ORAL | 5 refills | Status: DC | PRN

## 2016-06-17 NOTE — Telephone Encounter (Signed)
[facility] [address] [phone] called to advise, Rx for tiZANidine (ZANAFLEX) 4 MG tablet not received for 4mg  Quantity 120.

## 2016-06-17 NOTE — Telephone Encounter (Signed)
Pt called stating her pharmacy is not giving her the right amount of tiZANidine (ZANAFLEX) 4 MG tablet. She is getting 30 pills and not 120. The rx states 120 and her pharmacy will not change it. Please call and advise [phone]. She says she has been talk to a Public librarian at [facility] on [address].

## 2016-06-17 NOTE — Telephone Encounter (Signed)
Called pharmacy and they did not receive new rx that was sent on 06/03/16 for #120. Re-sent rx w/ corrected qty. Pt notified via TC.

## 2016-06-17 NOTE — Telephone Encounter (Signed)
Suella Broad can you send this see telephone below. Thanks Hinton Dyer

## 2016-06-17 NOTE — Telephone Encounter (Signed)
See previous phone note.  

## 2016-06-17 NOTE — Telephone Encounter (Signed)
Spoke to patient - she would like her pain referral sent to [facility] - [phone].

## 2016-06-18 ENCOUNTER — Ambulatory Visit (HOSPITAL_COMMUNITY)
Admission: RE | Admit: 2016-06-18 | Discharge: 2016-06-18 | Disposition: A | Discharge status: Home / Self Care | Payer: Medicaid Other | Source: Ambulatory Visit | Attending: Gastroenterology | Admitting: Gastroenterology

## 2016-06-18 ENCOUNTER — Encounter (HOSPITAL_COMMUNITY)
Admission: RE | Disposition: A | Discharge status: Home / Self Care | Payer: Self-pay | Source: Ambulatory Visit | Attending: Gastroenterology

## 2016-06-18 DIAGNOSIS — K5900 Constipation, unspecified: Secondary | ICD-10-CM | POA: Insufficient documentation

## 2016-06-18 HISTORY — PX: ANAL RECTAL MANOMETRY: SHX6358

## 2016-06-18 SURGERY — MANOMETRY, ANORECTAL

## 2016-06-19 ENCOUNTER — Encounter (HOSPITAL_COMMUNITY): Payer: Self-pay | Admitting: Gastroenterology

## 2016-06-23 ENCOUNTER — Other Ambulatory Visit: Payer: Self-pay | Admitting: Gastroenterology

## 2016-06-23 ENCOUNTER — Ambulatory Visit
Admission: RE | Admit: 2016-06-23 | Discharge: 2016-06-23 | Disposition: A | Discharge status: Home / Self Care | Payer: Medicaid Other | Source: Ambulatory Visit | Attending: Gastroenterology | Admitting: Gastroenterology

## 2016-06-23 DIAGNOSIS — K5909 Other constipation: Secondary | ICD-10-CM

## 2016-06-25 ENCOUNTER — Ambulatory Visit
Admission: RE | Admit: 2016-06-25 | Discharge: 2016-06-25 | Disposition: A | Discharge status: Home / Self Care | Payer: Medicaid Other | Source: Ambulatory Visit | Attending: Gastroenterology | Admitting: Gastroenterology

## 2016-06-25 ENCOUNTER — Other Ambulatory Visit: Payer: Self-pay | Admitting: Gastroenterology

## 2016-06-25 DIAGNOSIS — K5900 Constipation, unspecified: Secondary | ICD-10-CM

## 2016-06-27 ENCOUNTER — Ambulatory Visit
Admission: RE | Admit: 2016-06-27 | Discharge: 2016-06-27 | Disposition: A | Discharge status: Home / Self Care | Payer: Medicaid Other | Source: Ambulatory Visit | Attending: Gastroenterology | Admitting: Gastroenterology

## 2016-06-27 ENCOUNTER — Other Ambulatory Visit: Payer: Self-pay | Admitting: Gastroenterology

## 2016-06-27 DIAGNOSIS — K5900 Constipation, unspecified: Secondary | ICD-10-CM

## 2016-06-30 ENCOUNTER — Ambulatory Visit (INDEPENDENT_AMBULATORY_CARE_PROVIDER_SITE_OTHER): Payer: Medicaid Other | Admitting: Obstetrics & Gynecology

## 2016-06-30 ENCOUNTER — Encounter: Payer: Self-pay | Admitting: Obstetrics & Gynecology

## 2016-06-30 VITALS — BP 132/75 | HR 87 | Wt 262.1 lb

## 2016-06-30 DIAGNOSIS — N9489 Other specified conditions associated with female genital organs and menstrual cycle: Secondary | ICD-10-CM | POA: Diagnosis present

## 2016-06-30 DIAGNOSIS — N898 Other specified noninflammatory disorders of vagina: Secondary | ICD-10-CM

## 2016-06-30 DIAGNOSIS — N939 Abnormal uterine and vaginal bleeding, unspecified: Secondary | ICD-10-CM

## 2016-06-30 MED ORDER — MEGESTROL ACETATE 40 MG PO TABS: 80.0000 mg | ORAL_TABLET | Freq: Two times a day (BID) | ORAL | 10 refills | Status: DC

## 2016-06-30 NOTE — Patient Instructions (Signed)
Return to clinic for any scheduled appointments or for any gynecologic concerns as needed.   

## 2016-06-30 NOTE — Addendum Note (Signed)
Addended by: Verita Schneiders A on: 06/30/2016 11:30 AM   Modules accepted: Orders
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
## 2016-07-01 LAB — WET PREP, GENITAL
Trich, Wet Prep: NONE SEEN
Yeast Wet Prep HPF POC: NONE SEEN

## 2016-07-07 ENCOUNTER — Telehealth: Payer: Self-pay | Admitting: Neurology

## 2016-07-07 NOTE — Telephone Encounter (Signed)
Talked to pt. Reports that she was unable to have MRI d/t anxiety and shortness of breath. Says that she needs to reschedule w/ [facility] but would like to have sedative and oxygen during procedure. Has appt scheduled w/ her pulmonogist on 07/15/16 and is willing to wait until afterwards for MRI.

## 2016-07-07 NOTE — Telephone Encounter (Signed)
Called pt back and asked her to call after pulmonology appt to reschedule MRI. Can also give rx for sedative at that time, if needed.

## 2016-07-07 NOTE — Telephone Encounter (Signed)
Pt called inquiring if referral for pain management was sent. She has not heard anything from that office

## 2016-07-07 NOTE — Telephone Encounter (Signed)
Pt called said she needs medication to help with MRI. Pt said she could not breath and is not sure if she is claustrophobic or if it was a breathing problem. She was scheduled at GI.

## 2016-07-07 NOTE — Telephone Encounter (Signed)
We can give sedative but if she is having breathing problems would prefer to wait to after her visit with pulmonology before proceeding with MRI.

## 2016-07-08 ENCOUNTER — Ambulatory Visit: Payer: Medicaid Other | Admitting: Allergy and Immunology

## 2016-07-15 ENCOUNTER — Encounter: Payer: Self-pay | Admitting: Internal Medicine

## 2016-07-15 ENCOUNTER — Ambulatory Visit (INDEPENDENT_AMBULATORY_CARE_PROVIDER_SITE_OTHER): Payer: Medicaid Other | Admitting: Internal Medicine

## 2016-07-15 VITALS — BP 134/68 | HR 93 | Ht 62.0 in | Wt 263.0 lb

## 2016-07-15 DIAGNOSIS — R0601 Orthopnea: Secondary | ICD-10-CM

## 2016-07-15 DIAGNOSIS — R490 Dysphonia: Secondary | ICD-10-CM | POA: Diagnosis not present

## 2016-07-15 DIAGNOSIS — R0600 Dyspnea, unspecified: Secondary | ICD-10-CM | POA: Diagnosis not present

## 2016-07-15 DIAGNOSIS — R0689 Other abnormalities of breathing: Secondary | ICD-10-CM

## 2016-07-15 DIAGNOSIS — Z87891 Personal history of nicotine dependence: Secondary | ICD-10-CM | POA: Diagnosis not present

## 2016-07-15 DIAGNOSIS — R600 Localized edema: Secondary | ICD-10-CM

## 2016-07-15 NOTE — Patient Instructions (Addendum)
Dyspnea and respiratory abnormality Stopped smoking with greater than 40 pack year history Pedal edema Hx lung mass  - do full PFT  - do CT chest wo contrast - do ONO test - do walk test on room air  Sleeps in sitting position due to orthopnea - refer sleep doc based on above results ; will decide this at followup   Hoarseness of voice = refer eNT Dr Ernesto Rutherford   Followup  - next few to several weeks but after completing tests; can see APP
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
## 2016-07-21 ENCOUNTER — Inpatient Hospital Stay: Admission: RE | Admit: 2016-07-21 | Payer: Medicaid Other | Source: Ambulatory Visit

## 2016-07-24 ENCOUNTER — Encounter: Payer: Self-pay | Admitting: Internal Medicine

## 2016-07-24 ENCOUNTER — Ambulatory Visit (HOSPITAL_COMMUNITY)
Admission: RE | Admit: 2016-07-24 | Discharge: 2016-07-24 | Disposition: A | Discharge status: Home / Self Care | Payer: Medicaid Other | Source: Ambulatory Visit | Attending: Internal Medicine | Admitting: Internal Medicine

## 2016-07-24 DIAGNOSIS — R0689 Other abnormalities of breathing: Secondary | ICD-10-CM

## 2016-07-24 DIAGNOSIS — R0600 Dyspnea, unspecified: Secondary | ICD-10-CM | POA: Diagnosis not present

## 2016-07-24 LAB — PULMONARY FUNCTION TEST
DL/VA % pred: 119 %
DL/VA: 5.59 ml/min/mmHg/L
DLCO unc % pred: 56 %
DLCO unc: 13.05 ml/min/mmHg
FEF 25-75 Post: 1.3 L/sec
FEF 25-75 Pre: 2.02 L/sec
FEF2575-%Change-Post: -35 %
FEF2575-%Pred-Post: 46 %
FEF2575-%Pred-Pre: 71 %
FEV1-%Change-Post: -7 %
FEV1-%Pred-Post: 49 %
FEV1-%Pred-Pre: 53 %
FEV1-Post: 1.36 L
FEV1-Pre: 1.48 L
FEV1FVC-%Change-Post: -6 %
FEV1FVC-%Pred-Pre: 107 %
FEV6-%Change-Post: -1 %
FEV6-%Pred-Post: 49 %
FEV6-%Pred-Pre: 50 %
FEV6-Post: 1.67 L
FEV6-Pre: 1.7 L
FEV6FVC-%Pred-Post: 103 %
FEV6FVC-%Pred-Pre: 103 %
FVC-%Change-Post: -1 %
FVC-%Pred-Post: 48 %
FVC-%Pred-Pre: 48 %
FVC-Post: 1.67 L
FVC-Pre: 1.7 L
Post FEV1/FVC ratio: 81 %
Post FEV6/FVC ratio: 100 %
Pre FEV1/FVC ratio: 87 %
Pre FEV6/FVC Ratio: 100 %
RV % pred: 66 %
RV: 1.12 L
TLC % pred: 61 %
TLC: 3.03 L

## 2016-07-24 MED ORDER — ALBUTEROL SULFATE (2.5 MG/3ML) 0.083% IN NEBU
2.5000 mg | INHALATION_SOLUTION | Freq: Once | RESPIRATORY_TRACT | Status: AC
  Administered 2016-07-24: 2.5 mg via RESPIRATORY_TRACT

## 2016-08-01 ENCOUNTER — Encounter: Payer: Self-pay | Admitting: Radiology

## 2016-08-01 ENCOUNTER — Ambulatory Visit (INDEPENDENT_AMBULATORY_CARE_PROVIDER_SITE_OTHER)
Admission: RE | Admit: 2016-08-01 | Discharge: 2016-08-01 | Disposition: A | Discharge status: Home / Self Care | Payer: Medicaid Other | Source: Ambulatory Visit | Attending: Internal Medicine | Admitting: Internal Medicine

## 2016-08-01 DIAGNOSIS — R0600 Dyspnea, unspecified: Secondary | ICD-10-CM

## 2016-08-01 DIAGNOSIS — R0689 Other abnormalities of breathing: Secondary | ICD-10-CM | POA: Diagnosis not present

## 2016-08-05 ENCOUNTER — Telehealth: Payer: Self-pay | Admitting: Internal Medicine

## 2016-08-05 NOTE — Telephone Encounter (Signed)
Pt called said PCP had ordered the open MRI but medicaid would not authorize it bc it had already been auth at GI. She said she does not know where PCP tried to schedule the open MRI. She said to pls call Jetta @ Dr Tinnie Gens  [phone] office to discuss this.

## 2016-08-05 NOTE — Progress Notes (Signed)
Called and spoke to pt and informed her to keep her appt on 9.13.17 to review results of CT with MR. Pt verbalized understanding and denied any further questions or concerns at this time.

## 2016-08-05 NOTE — Telephone Encounter (Signed)
ONO 07/24/16 - mean pulse ox 93%, times </= 88% Is 64min 48 sec. Total test time 11h. No need for night o2. Seeing her mid-sept 2017  Dr. Brand Males, M.D., [facility].C.P Pulmonary and Critical Care Medicine Staff Physician [place] [facility] Pager: [phone], If no answer or between  15:00h - 7:00h: call [phone]  08/05/2016 2:58 PM

## 2016-08-06 ENCOUNTER — Telehealth (HOSPITAL_COMMUNITY): Payer: Self-pay | Admitting: Vascular Surgery

## 2016-08-06 ENCOUNTER — Ambulatory Visit (INDEPENDENT_AMBULATORY_CARE_PROVIDER_SITE_OTHER): Payer: Medicaid Other | Admitting: Internal Medicine

## 2016-08-06 ENCOUNTER — Other Ambulatory Visit: Payer: Self-pay | Admitting: Internal Medicine

## 2016-08-06 ENCOUNTER — Encounter: Payer: Self-pay | Admitting: Internal Medicine

## 2016-08-06 VITALS — BP 108/60 | HR 76 | Ht 62.0 in | Wt 259.0 lb

## 2016-08-06 DIAGNOSIS — R0689 Other abnormalities of breathing: Secondary | ICD-10-CM

## 2016-08-06 DIAGNOSIS — R0600 Dyspnea, unspecified: Secondary | ICD-10-CM | POA: Diagnosis not present

## 2016-08-06 DIAGNOSIS — R1310 Dysphagia, unspecified: Secondary | ICD-10-CM

## 2016-08-06 DIAGNOSIS — Q278 Other specified congenital malformations of peripheral vascular system: Secondary | ICD-10-CM | POA: Insufficient documentation

## 2016-08-06 DIAGNOSIS — D134 Benign neoplasm of liver: Secondary | ICD-10-CM | POA: Insufficient documentation

## 2016-08-06 DIAGNOSIS — Q279 Congenital malformation of peripheral vascular system, unspecified: Secondary | ICD-10-CM

## 2016-08-06 MED ORDER — LOSARTAN POTASSIUM 25 MG PO TABS: 25.0000 mg | ORAL_TABLET | Freq: Every day | ORAL | 1 refills | Status: DC

## 2016-08-06 NOTE — Telephone Encounter (Signed)
Called and spoke to pt. Informed her of the results per MR, pt has appt today and will keep appt to review CT hcest results. Pt verbalized understanding and denied any further questions or concerns at this time.
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
## 2016-08-06 NOTE — Patient Instructions (Addendum)
Dyspnea and respiratory abnormality - stop lisinopril  - start losartan 25mg  daily - do feno test -if abnormal will start asthma inhalers - if normal, do CPST bike test with EIB challenge   Dysphagia Dysphagia lusoria Hepatic adenoma  - refer to Dr Paulita Fujita of [facility]   Followup  - after cpst -  6 weeks

## 2016-08-06 NOTE — Telephone Encounter (Signed)
Left message w/ PT husband to give a call back to schedule pt CPX

## 2016-08-07 LAB — NITRIC OXIDE: Nitric Oxide: 5

## 2016-08-07 NOTE — Addendum Note (Signed)
Addended by: Collier Salina on: 08/07/2016 02:53 PM   Modules accepted: Orders

## 2016-08-19 ENCOUNTER — Ambulatory Visit: Payer: Medicaid Other | Admitting: Allergy and Immunology

## 2016-08-21 ENCOUNTER — Telehealth: Payer: Self-pay | Admitting: *Deleted

## 2016-08-21 NOTE — Telephone Encounter (Signed)
°  [name] [name] DOB 2071/10/28 - wants open MRI to [facility]  - best call back [phone]  - [phone]

## 2016-08-28 ENCOUNTER — Ambulatory Visit (HOSPITAL_COMMUNITY): Payer: Medicaid Other | Attending: Internal Medicine

## 2016-08-28 DIAGNOSIS — R0689 Other abnormalities of breathing: Secondary | ICD-10-CM

## 2016-08-28 DIAGNOSIS — R0600 Dyspnea, unspecified: Secondary | ICD-10-CM

## 2016-09-04 ENCOUNTER — Encounter (HOSPITAL_COMMUNITY): Payer: Medicaid Other

## 2016-09-04 IMAGING — DX DG CHEST 2V
2 series · 2 of 2 positions shown · non-contrast
Comparison: 03/26/2016 .  05/04/2015.

CLINICAL DATA: Chest pain.

EXAM:
CHEST  2 VIEW

[chest pa]
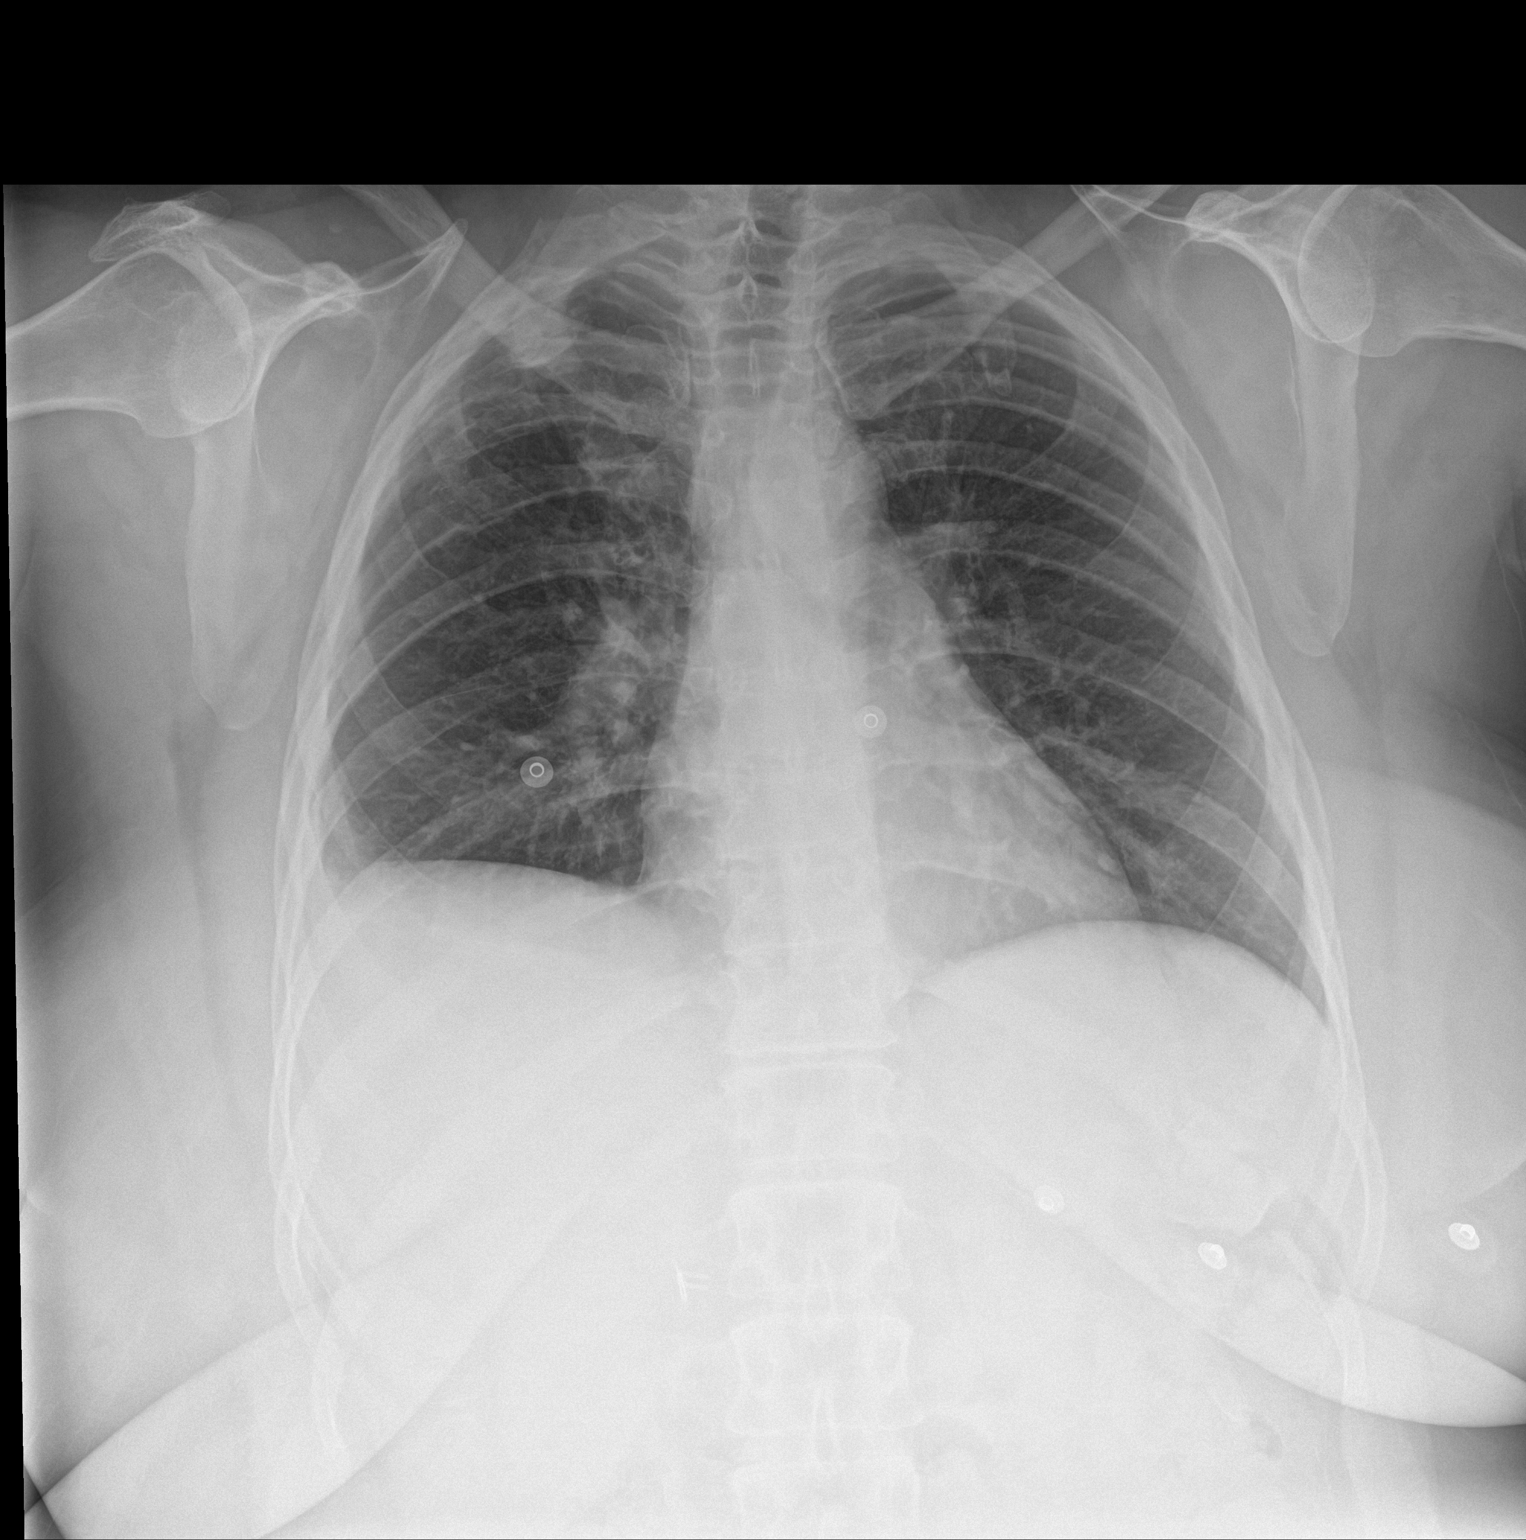

[chest lat]
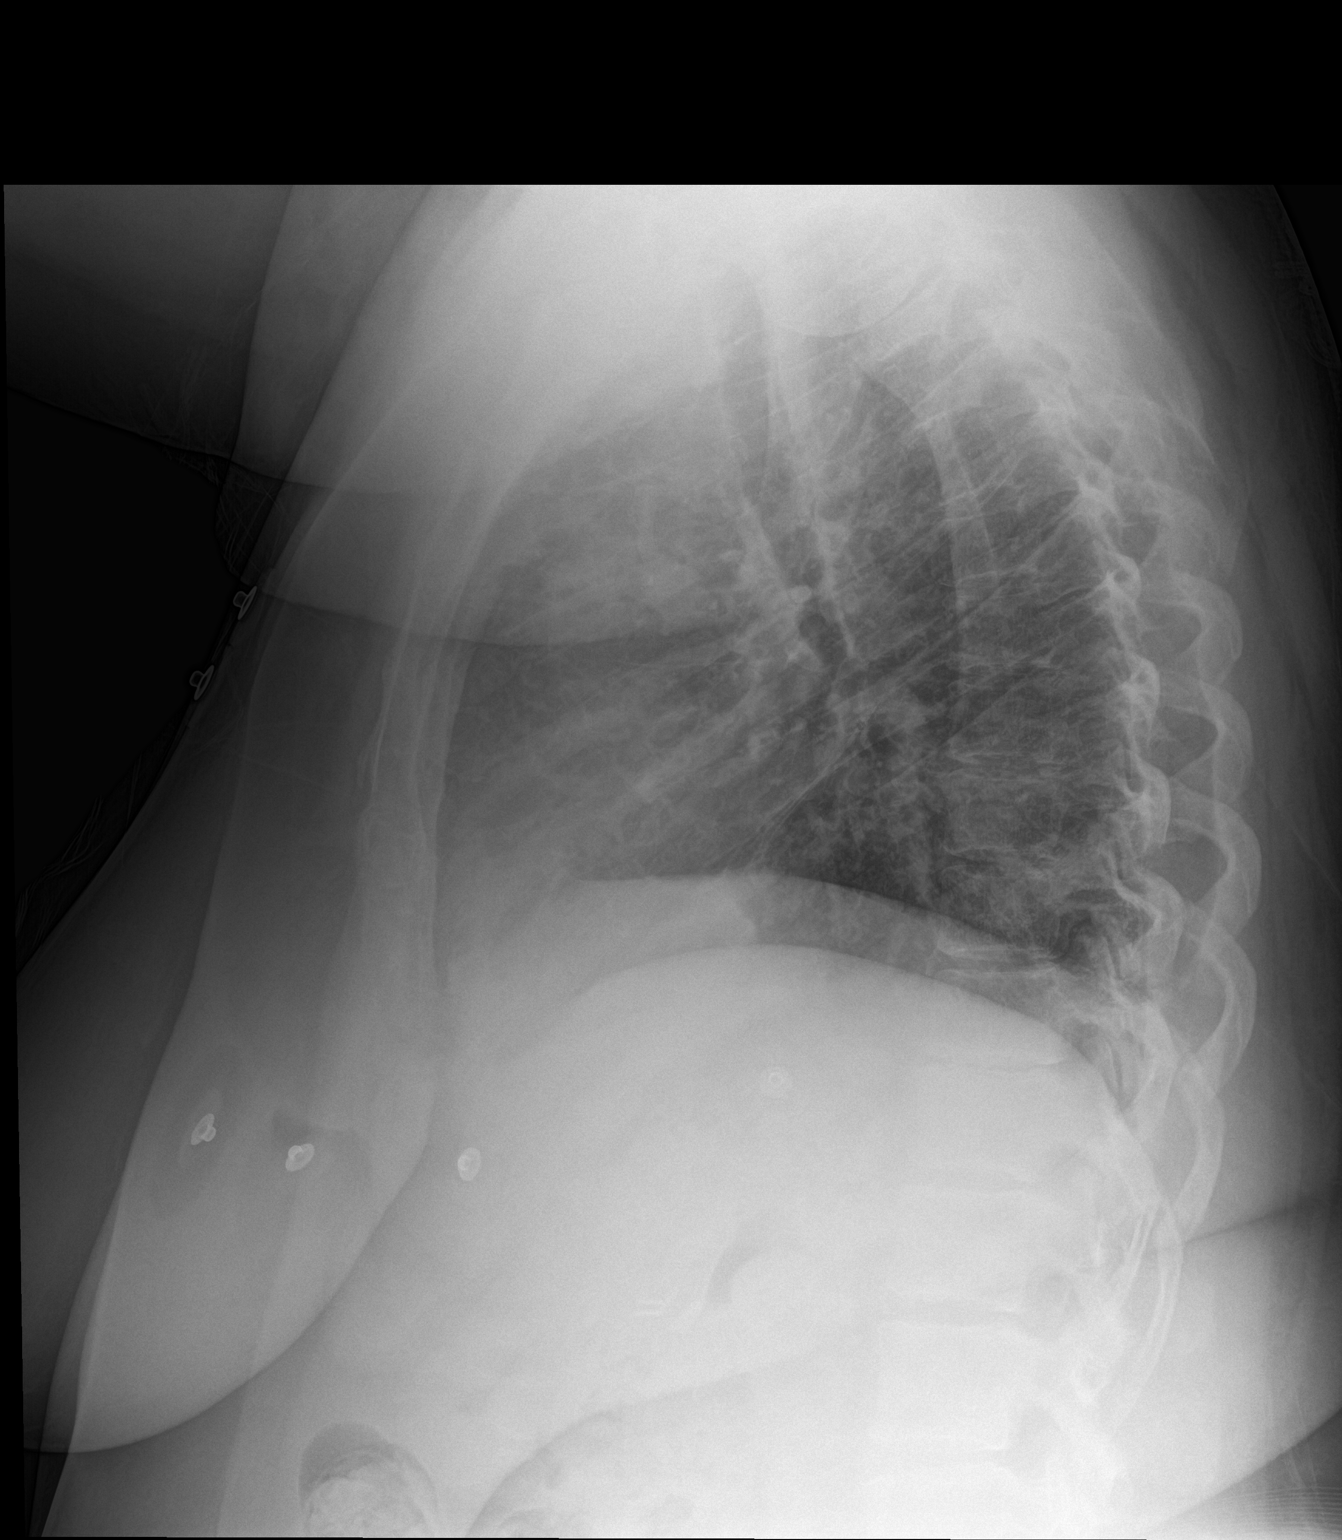

[2 of 2 positions shown; findings below may reference images not displayed]

FINDINGS: Mediastinum and hilar structures normal. Stable right base and right
upper pleural-parenchymal thickening consistent with scarring. No
acute infiltrate. Low lung volumes. Heart size normal. Stable
deformity right shoulder.
IMPRESSION: Stable right lung pleural parenchymal thickening consistent with
scarring. No active cardiopulmonary disease.

## 2016-09-17 ENCOUNTER — Ambulatory Visit: Payer: Medicaid Other | Admitting: Internal Medicine

## 2016-09-24 ENCOUNTER — Ambulatory Visit: Payer: Medicaid Other | Admitting: Allergy and Immunology

## 2016-09-24 HISTORY — PX: RECTAL POLYPECTOMY: SHX2309

## 2016-09-26 NOTE — Progress Notes (Signed)
Called and spoke to pt. Appt made with MR in 11/2015. Pt verbalized understanding and denied any further questions or concerns at this time.

## 2016-12-03 ENCOUNTER — Encounter: Payer: Self-pay | Admitting: Neurology

## 2016-12-03 ENCOUNTER — Other Ambulatory Visit: Payer: Self-pay | Admitting: Neurology

## 2016-12-03 ENCOUNTER — Ambulatory Visit (INDEPENDENT_AMBULATORY_CARE_PROVIDER_SITE_OTHER): Payer: Medicaid Other | Admitting: Neurology

## 2016-12-03 VITALS — BP 113/71 | HR 73 | Ht 62.0 in | Wt 240.0 lb

## 2016-12-03 DIAGNOSIS — G43009 Migraine without aura, not intractable, without status migrainosus: Secondary | ICD-10-CM | POA: Diagnosis not present

## 2016-12-03 DIAGNOSIS — M797 Fibromyalgia: Secondary | ICD-10-CM | POA: Diagnosis not present

## 2016-12-03 DIAGNOSIS — M546 Pain in thoracic spine: Secondary | ICD-10-CM | POA: Diagnosis not present

## 2016-12-03 DIAGNOSIS — G8929 Other chronic pain: Secondary | ICD-10-CM

## 2016-12-03 MED ORDER — DULOXETINE HCL 30 MG PO CPEP: 30.0000 mg | ORAL_CAPSULE | Freq: Two times a day (BID) | ORAL | 3 refills | Status: DC

## 2016-12-03 MED ORDER — LIDOCAINE 5 % EX PTCH: 1.0000 | MEDICATED_PATCH | CUTANEOUS | 3 refills | Status: DC

## 2016-12-03 MED ORDER — SUMATRIPTAN 5 MG/ACT NA SOLN: 1.0000 | Freq: Two times a day (BID) | NASAL | 3 refills | Status: DC | PRN

## 2016-12-03 MED ORDER — ALPRAZOLAM 0.5 MG PO TABS
ORAL_TABLET | ORAL | 0 refills | Status: DC
Start: 2016-12-03 — End: 2020-10-01
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
## 2016-12-03 NOTE — Patient Instructions (Signed)
   With the Cymbalta 30 mg tablet, start taking one daily for 2 weeks, then take one tablet twice a day. Call for dose adjustments.

## 2016-12-09 ENCOUNTER — Ambulatory Visit: Payer: Medicaid Other | Admitting: Internal Medicine

## 2016-12-09 ENCOUNTER — Telehealth: Payer: Self-pay

## 2016-12-09 NOTE — Telephone Encounter (Signed)
[id number] approved through 12/04/2017 through [place] [facility] # [phone].

## 2016-12-30 ENCOUNTER — Ambulatory Visit: Payer: Medicaid Other | Admitting: Internal Medicine

## 2017-02-23 ENCOUNTER — Telehealth: Payer: Self-pay | Admitting: Neurology

## 2017-02-23 NOTE — Telephone Encounter (Signed)
[name] [name] with [facility] informed me that medicaid did not approve the MR.. Needing more clinical information. The phone number for the peer to peer is [phone] opt 3 & 1. The case number is [id number]. She is scheduled to have this image done on Thursday 02/26/17.

## 2017-02-23 NOTE — Telephone Encounter (Signed)
    I called for the peer to peer review for the MRI scheduling for MRI of the thoracic spine. This has been approved, expiration date is on 03/22/2017. Approval number is [id number].

## 2017-02-24 NOTE — Telephone Encounter (Signed)
Noted, thank you

## 2017-02-26 ENCOUNTER — Ambulatory Visit
Admission: RE | Admit: 2017-02-26 | Discharge: 2017-02-26 | Disposition: A | Discharge status: Home / Self Care | Payer: Medicaid Other | Source: Ambulatory Visit | Attending: Neurology | Admitting: Neurology

## 2017-02-26 DIAGNOSIS — G8929 Other chronic pain: Secondary | ICD-10-CM | POA: Diagnosis not present

## 2017-02-26 DIAGNOSIS — M546 Pain in thoracic spine: Principal | ICD-10-CM

## 2017-02-28 ENCOUNTER — Other Ambulatory Visit: Payer: Self-pay | Admitting: Neurology

## 2017-02-28 ENCOUNTER — Telehealth: Payer: Self-pay | Admitting: Neurology

## 2017-02-28 MED ORDER — DULOXETINE HCL 60 MG PO CPEP: 60.0000 mg | ORAL_CAPSULE | Freq: Two times a day (BID) | ORAL | 4 refills | Status: DC

## 2017-02-28 NOTE — Telephone Encounter (Signed)
I called the patient. The MRI of the thoracic spine is ok, she has not had MRI of the cervical area before. We will go up on the cymbalta to 90 mg daily until the 30 mg tablets are used up, then we will go to 60 mg twice a day.   MRI thoracic 02/27/17:  IMPRESSION:  The thoracic spine without contrast shows minimal disc bulging at T11-T12 that does not lead to any nerve root impingement. The neural foramina are widely patent at every level. The spinal cord appears normal.

## 2017-04-28 ENCOUNTER — Other Ambulatory Visit: Payer: Self-pay | Admitting: Neurology

## 2017-04-29 ENCOUNTER — Other Ambulatory Visit: Payer: Self-pay | Admitting: Neurology

## 2017-06-04 ENCOUNTER — Encounter: Payer: Self-pay | Admitting: Adult Health

## 2017-06-04 ENCOUNTER — Ambulatory Visit (INDEPENDENT_AMBULATORY_CARE_PROVIDER_SITE_OTHER): Payer: Medicaid Other | Admitting: Adult Health

## 2017-06-04 VITALS — BP 142/80 | HR 79 | Wt 228.6 lb

## 2017-06-04 DIAGNOSIS — R200 Anesthesia of skin: Secondary | ICD-10-CM | POA: Diagnosis not present

## 2017-06-04 DIAGNOSIS — R51 Headache: Secondary | ICD-10-CM

## 2017-06-04 DIAGNOSIS — R519 Headache, unspecified: Secondary | ICD-10-CM

## 2017-06-04 MED ORDER — TIZANIDINE HCL 4 MG PO TABS: ORAL_TABLET | ORAL | 3 refills | Status: DC

## 2017-06-04 MED ORDER — SUMATRIPTAN 5 MG/ACT NA SOLN: 1.0000 | Freq: Two times a day (BID) | NASAL | 3 refills | Status: DC | PRN
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
## 2017-06-04 NOTE — Progress Notes (Signed)
I have read the note, and I agree with the clinical assessment and plan.  Jvon Meroney KEITH   

## 2017-06-04 NOTE — Patient Instructions (Addendum)
Your Plan:  Continue Imitrex  for headaches Tizanidine for muscle spasms If your symptoms worsen or you develop new symptoms please let us know.      Thank you for coming to see [place] at [facility]. I hope we have been able to provide you high quality care today.  You may receive a patient satisfaction survey over the next few weeks. We would appreciate your feedback and comments so that we may continue to improve ourselves and the health of our patients.

## 2017-08-26 ENCOUNTER — Other Ambulatory Visit: Payer: Self-pay | Admitting: Obstetrics & Gynecology

## 2017-08-26 DIAGNOSIS — N939 Abnormal uterine and vaginal bleeding, unspecified: Secondary | ICD-10-CM

## 2017-09-21 ENCOUNTER — Ambulatory Visit: Payer: Medicaid Other | Admitting: Adult Health

## 2017-09-22 ENCOUNTER — Encounter: Payer: Self-pay | Admitting: Adult Health

## 2017-09-22 ENCOUNTER — Telehealth: Payer: Self-pay | Admitting: *Deleted

## 2017-09-22 NOTE — Telephone Encounter (Signed)
Patient was no show for follow up with Edman Circle, NP on 09/21/17.

## 2018-08-03 ENCOUNTER — Ambulatory Visit (INDEPENDENT_AMBULATORY_CARE_PROVIDER_SITE_OTHER): Payer: Self-pay | Admitting: General Practice

## 2018-08-03 DIAGNOSIS — R300 Dysuria: Secondary | ICD-10-CM

## 2018-08-03 LAB — POCT URINALYSIS DIP (DEVICE)
Glucose, UA: NEGATIVE mg/dL
Hgb urine dipstick: NEGATIVE
Leukocytes, UA: NEGATIVE
Nitrite: NEGATIVE
Protein, ur: 30 mg/dL — AB
Specific Gravity, Urine: >=1.03 (ref 1.005–1.030)
Urobilinogen, UA: 0.2 mg/dL (ref 0.0–1.0)
pH: 5.5 (ref 5.0–8.0)

## 2018-08-03 NOTE — Progress Notes (Signed)
Patient came into office and dropped off urine sample for UTI. Called patient via telephone to inquire about her symptoms. Patient reports for the past week dysuria and incomplete emptying of her bladder. Will order urine culture. Discussed with patient we will contact her with any abnormal results. Patient verbalized understanding & had no questions.

## 2018-08-04 NOTE — Progress Notes (Signed)
Chart reviewed for nurse visit. Agree with plan of care.   [place]arr [place], [place] 08/04/2018 9:09 AM

## 2018-09-17 ENCOUNTER — Ambulatory Visit: Payer: Self-pay | Admitting: Obstetrics & Gynecology

## 2018-10-11 ENCOUNTER — Other Ambulatory Visit: Payer: Self-pay | Admitting: Neurology

## 2018-10-29 ENCOUNTER — Ambulatory Visit: Payer: Self-pay | Admitting: Obstetrics & Gynecology

## 2018-10-29 NOTE — Progress Notes (Deleted)
   Patient did not show up today for her scheduled appointment.   Jocelynne Duquette, MD, FACOG Obstetrician & Gynecologist, [facility], [facility]  

## 2019-01-13 ENCOUNTER — Other Ambulatory Visit: Payer: Self-pay | Admitting: Anesthesiology

## 2019-01-13 DIAGNOSIS — M545 Low back pain, unspecified: Secondary | ICD-10-CM

## 2019-01-13 DIAGNOSIS — M546 Pain in thoracic spine: Secondary | ICD-10-CM

## 2019-02-02 ENCOUNTER — Other Ambulatory Visit: Payer: Medicaid Other

## 2019-02-02 ENCOUNTER — Inpatient Hospital Stay: Admission: RE | Admit: 2019-02-02 | Payer: Medicaid Other | Source: Ambulatory Visit

## 2019-02-15 ENCOUNTER — Telehealth: Payer: Self-pay | Admitting: *Deleted

## 2019-02-15 NOTE — Telephone Encounter (Signed)
REFERRAL SENT TO SCHEDULING AND NOTES ON FILE FROM DR. EVANS-BLOUNT [phone].
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
## 2019-06-29 ENCOUNTER — Ambulatory Visit: Payer: Medicaid Other | Admitting: Neurology

## 2019-06-29 ENCOUNTER — Encounter: Payer: Self-pay | Admitting: Neurology

## 2019-06-29 ENCOUNTER — Telehealth: Payer: Self-pay

## 2019-06-29 NOTE — Telephone Encounter (Signed)
Patient was a no call/no show for their appointment today.   

## 2019-12-22 ENCOUNTER — Other Ambulatory Visit: Payer: Self-pay | Admitting: Anesthesiology

## 2019-12-22 DIAGNOSIS — M160 Bilateral primary osteoarthritis of hip: Secondary | ICD-10-CM

## 2019-12-22 DIAGNOSIS — M5134 Other intervertebral disc degeneration, thoracic region: Secondary | ICD-10-CM

## 2019-12-22 DIAGNOSIS — M5136 Other intervertebral disc degeneration, lumbar region: Secondary | ICD-10-CM

## 2020-02-01 ENCOUNTER — Ambulatory Visit
Admission: RE | Admit: 2020-02-01 | Discharge: 2020-02-01 | Disposition: A | Discharge status: Home / Self Care | Payer: Medicaid Other | Source: Ambulatory Visit | Attending: Anesthesiology | Admitting: Anesthesiology

## 2020-02-01 ENCOUNTER — Other Ambulatory Visit: Payer: Medicaid Other

## 2020-02-01 DIAGNOSIS — M5134 Other intervertebral disc degeneration, thoracic region: Secondary | ICD-10-CM

## 2020-02-01 DIAGNOSIS — M5136 Other intervertebral disc degeneration, lumbar region: Secondary | ICD-10-CM

## 2020-02-01 DIAGNOSIS — M160 Bilateral primary osteoarthritis of hip: Secondary | ICD-10-CM

## 2020-02-02 ENCOUNTER — Other Ambulatory Visit: Payer: Medicaid Other

## 2020-02-10 ENCOUNTER — Encounter: Payer: Self-pay | Admitting: Obstetrics & Gynecology

## 2020-02-10 ENCOUNTER — Other Ambulatory Visit: Payer: Self-pay

## 2020-02-10 ENCOUNTER — Other Ambulatory Visit (HOSPITAL_COMMUNITY)
Admission: RE | Admit: 2020-02-10 | Discharge: 2020-02-10 | Disposition: A | Discharge status: Home / Self Care | Payer: Medicaid Other | Source: Ambulatory Visit | Attending: Obstetrics & Gynecology | Admitting: Obstetrics & Gynecology

## 2020-02-10 ENCOUNTER — Ambulatory Visit (INDEPENDENT_AMBULATORY_CARE_PROVIDER_SITE_OTHER): Payer: Medicaid Other | Admitting: Obstetrics & Gynecology

## 2020-02-10 VITALS — BP 121/73 | HR 76 | Wt 221.9 lb

## 2020-02-10 DIAGNOSIS — R399 Unspecified symptoms and signs involving the genitourinary system: Secondary | ICD-10-CM | POA: Diagnosis not present

## 2020-02-10 DIAGNOSIS — L989 Disorder of the skin and subcutaneous tissue, unspecified: Secondary | ICD-10-CM | POA: Diagnosis not present

## 2020-02-10 DIAGNOSIS — Z01419 Encounter for gynecological examination (general) (routine) without abnormal findings: Secondary | ICD-10-CM | POA: Insufficient documentation

## 2020-02-10 DIAGNOSIS — Z1231 Encounter for screening mammogram for malignant neoplasm of breast: Secondary | ICD-10-CM | POA: Diagnosis not present

## 2020-02-10 MED ORDER — SULFAMETHOXAZOLE-TRIMETHOPRIM 800-160 MG PO TABS: 1.0000 | ORAL_TABLET | Freq: Two times a day (BID) | ORAL | 1 refills | Status: AC

## 2020-02-10 MED ORDER — PHENAZOPYRIDINE HCL 200 MG PO TABS: 200.0000 mg | ORAL_TABLET | Freq: Three times a day (TID) | ORAL | 1 refills | Status: DC | PRN

## 2020-02-10 MED ORDER — MUPIROCIN 2 % EX OINT: 1.0000 "application " | TOPICAL_OINTMENT | Freq: Two times a day (BID) | CUTANEOUS | 3 refills | Status: DC
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
## 2020-02-10 NOTE — Patient Instructions (Signed)

## 2020-02-12 LAB — URINE CULTURE

## 2020-02-14 LAB — CYTOLOGY - PAP
Comment: NEGATIVE
Diagnosis: NEGATIVE
High risk HPV: NEGATIVE

## 2020-02-17 ENCOUNTER — Ambulatory Visit: Payer: Medicaid Other

## 2020-02-22 ENCOUNTER — Other Ambulatory Visit: Payer: Self-pay

## 2020-02-22 ENCOUNTER — Emergency Department (HOSPITAL_COMMUNITY)
Admission: EM | Admit: 2020-02-22 | Discharge: 2020-02-22 | Disposition: A | Discharge status: Home / Self Care | Payer: Medicaid Other | Attending: Emergency Medicine | Admitting: Emergency Medicine

## 2020-02-22 ENCOUNTER — Emergency Department (HOSPITAL_COMMUNITY): Payer: Medicaid Other

## 2020-02-22 ENCOUNTER — Encounter (HOSPITAL_COMMUNITY): Payer: Self-pay

## 2020-02-22 DIAGNOSIS — J45909 Unspecified asthma, uncomplicated: Secondary | ICD-10-CM | POA: Diagnosis not present

## 2020-02-22 DIAGNOSIS — Z9049 Acquired absence of other specified parts of digestive tract: Secondary | ICD-10-CM | POA: Diagnosis not present

## 2020-02-22 DIAGNOSIS — Z79899 Other long term (current) drug therapy: Secondary | ICD-10-CM | POA: Insufficient documentation

## 2020-02-22 DIAGNOSIS — E119 Type 2 diabetes mellitus without complications: Secondary | ICD-10-CM | POA: Diagnosis not present

## 2020-02-22 DIAGNOSIS — I10 Essential (primary) hypertension: Secondary | ICD-10-CM | POA: Insufficient documentation

## 2020-02-22 DIAGNOSIS — F17210 Nicotine dependence, cigarettes, uncomplicated: Secondary | ICD-10-CM | POA: Diagnosis not present

## 2020-02-22 DIAGNOSIS — M25512 Pain in left shoulder: Secondary | ICD-10-CM

## 2020-02-22 NOTE — ED Triage Notes (Signed)
Pt c/o left shoulder pain, and left arm pain. Pt denies trauma or injury to shoulder. Pt has chronic back and hip pain that she takes oxycodone and fentanyl patches for, as home pain regimen.

## 2020-02-22 NOTE — ED Notes (Signed)
An After Visit Summary was printed and given to the patient. Discharge instructions given and no further questions at this time.  
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
## 2020-02-22 NOTE — Discharge Instructions (Signed)
Wear sling as needed for support but avoid wearing it for any extended period of time as it can cause frozen shoulder.  Follow up with your doctor for further care.

## 2020-02-23 ENCOUNTER — Ambulatory Visit
Admission: RE | Admit: 2020-02-23 | Discharge: 2020-02-23 | Disposition: A | Discharge status: Home / Self Care | Payer: Medicaid Other | Source: Ambulatory Visit | Attending: Anesthesiology | Admitting: Anesthesiology

## 2020-02-23 DIAGNOSIS — M5134 Other intervertebral disc degeneration, thoracic region: Secondary | ICD-10-CM

## 2020-02-23 DIAGNOSIS — M160 Bilateral primary osteoarthritis of hip: Secondary | ICD-10-CM

## 2020-02-23 DIAGNOSIS — M5136 Other intervertebral disc degeneration, lumbar region: Secondary | ICD-10-CM

## 2020-02-27 ENCOUNTER — Other Ambulatory Visit: Payer: Self-pay | Admitting: Obstetrics & Gynecology

## 2020-02-27 DIAGNOSIS — Z1231 Encounter for screening mammogram for malignant neoplasm of breast: Secondary | ICD-10-CM

## 2020-02-28 ENCOUNTER — Ambulatory Visit: Payer: Medicaid Other

## 2020-03-07 ENCOUNTER — Ambulatory Visit: Payer: Medicaid Other

## 2020-07-25 DIAGNOSIS — I10 Essential (primary) hypertension: Secondary | ICD-10-CM | POA: Insufficient documentation

## 2020-07-25 DIAGNOSIS — M5416 Radiculopathy, lumbar region: Secondary | ICD-10-CM | POA: Insufficient documentation

## 2020-07-31 ENCOUNTER — Other Ambulatory Visit: Payer: Self-pay | Admitting: Neurological Surgery

## 2020-07-31 DIAGNOSIS — M5416 Radiculopathy, lumbar region: Secondary | ICD-10-CM

## 2020-08-19 ENCOUNTER — Inpatient Hospital Stay: Admission: RE | Admit: 2020-08-19 | Payer: Medicaid Other | Source: Ambulatory Visit

## 2020-09-09 ENCOUNTER — Ambulatory Visit
Admission: RE | Admit: 2020-09-09 | Discharge: 2020-09-09 | Disposition: A | Discharge status: Home / Self Care | Payer: Medicaid Other | Source: Ambulatory Visit | Attending: Neurological Surgery | Admitting: Neurological Surgery

## 2020-09-09 ENCOUNTER — Other Ambulatory Visit: Payer: Self-pay

## 2020-09-09 DIAGNOSIS — M5416 Radiculopathy, lumbar region: Secondary | ICD-10-CM

## 2020-09-12 ENCOUNTER — Other Ambulatory Visit: Payer: Self-pay | Admitting: Neurological Surgery

## 2020-09-12 DIAGNOSIS — M5127 Other intervertebral disc displacement, lumbosacral region: Secondary | ICD-10-CM | POA: Insufficient documentation

## 2020-09-14 ENCOUNTER — Inpatient Hospital Stay (HOSPITAL_COMMUNITY): Admission: RE | Admit: 2020-09-14 | Payer: Medicaid Other | Source: Ambulatory Visit

## 2020-09-19 ENCOUNTER — Other Ambulatory Visit (HOSPITAL_COMMUNITY)
Admission: RE | Admit: 2020-09-19 | Discharge: 2020-09-19 | Disposition: A | Discharge status: Home / Self Care | Payer: Medicaid Other | Source: Ambulatory Visit | Attending: Neurological Surgery | Admitting: Neurological Surgery

## 2020-09-19 DIAGNOSIS — Z20822 Contact with and (suspected) exposure to covid-19: Secondary | ICD-10-CM | POA: Insufficient documentation

## 2020-09-19 DIAGNOSIS — Z01812 Encounter for preprocedural laboratory examination: Secondary | ICD-10-CM | POA: Insufficient documentation

## 2020-09-19 LAB — SARS CORONAVIRUS 2 (TAT 6-24 HRS): SARS Coronavirus 2: NEGATIVE

## 2020-09-19 NOTE — Anesthesia Preprocedure Evaluation (Addendum)
Anesthesia Evaluation  Patient identified by MRN, date of birth, ID band Patient awake    Reviewed: Allergy & Precautions, H&P , NPO status , Patient's Chart, lab work & pertinent test results  Airway Mallampati: II   Neck ROM: full    Dental  (+) Edentulous Upper, Poor Dentition, Dental Advisory Given,    Pulmonary asthma , sleep apnea , Current Smoker and Patient abstained from smoking.,    breath sounds clear to auscultation       Cardiovascular hypertension,  Rhythm:regular Rate:Normal     Neuro/Psych  Headaches, PSYCHIATRIC DISORDERS Anxiety Bipolar Disorder    GI/Hepatic GERD  ,  Endo/Other  diabetes, Type 2obese  Renal/GU      Musculoskeletal  (+) Arthritis , Fibromyalgia -  Abdominal   Peds  Hematology   Anesthesia Other Findings   Reproductive/Obstetrics                           Anesthesia Physical Anesthesia Plan  ASA: II  Anesthesia Plan: General   Post-op Pain Management:    Induction: Intravenous  PONV Risk Score and Plan: 2 and Ondansetron, Dexamethasone, Midazolam and Treatment may vary due to age or medical condition  Airway Management Planned: Oral ETT  Additional Equipment:   Intra-op Plan:   Post-operative Plan: Extubation in OR  Informed Consent: I have reviewed the patients History and Physical, chart, labs and discussed the procedure including the risks, benefits and alternatives for the proposed anesthesia with the patient or authorized representative who has indicated his/her understanding and acceptance.       Plan Discussed with: CRNA, Anesthesiologist and Surgeon  Anesthesia Plan Comments: (PAT note by Karoline Caldwell, PA-C:  Patient was evaluated by cardiology in 2017 for report of chest pain.  Per Dr. Merrilee Jansky note 05/09/2016, her pain was deemed to be noncardiac.  Heart cath showed angiographically normal coronaries.  Echo showed normal structure,  EF 65%, no valvular abnormalities. She was advised to followup with cardiology as needed.   80-pack-year smoking history with COPD and DOE.  History of DM2, last A1c available in epic was 6.3 on 03/26/2016.  Will need day of surgery labs and evaluation.  EKG 02/22/2020: Sinus rhythm.  Rate 83. Does not appear significantly changed from tracing 04/25/16.  PFT 07/24/2016: FVC-%Pred-Pre Latest Units: % 48 FEV1-%Pred-Pre Latest Units: % 53 FEV1FVC-%Pred-Pre Latest Units: % 107 TLC % pred Latest Units: % 61 RV % pred Latest Units: % 66 DLCO unc % pred Latest Units: % 56  LHC 05/09/2016: Left Main Vessel was injected. Vessel is normal in caliber. Vessel is angiographically normal. Left Anterior Descending Vessel was injected. Vessel is normal in caliber. Vessel is angiographically normal. There are multiple small branch vessels that appear angiographically normal. Ramus Intermedius Vessel was injected. Vessel is normal in caliber. Vessel is angiographically normal. Left Circumflex Vessel was injected. Vessel is large. Vessel is angiographically normal. Right Coronary Artery Vessel was injected. Vessel is small. Vessel is angiographically normal. Osteal and proximal area had catheter induced spasm.  TTE 03/27/2016: - Left ventricle: The cavity size was normal. Systolic function was  normal. The estimated ejection fraction was in the range of 60%  to 65%. Wall motion was normal; there were no regional wall  motion abnormalities.  - Left atrium: The atrium was mildly dilated.  - Right atrium: The atrium was mildly dilated.   )       Anesthesia Quick Evaluation

## 2020-09-19 NOTE — Progress Notes (Signed)
Anesthesia Chart Review: Same day workup  Patient was evaluated by cardiology in 2017 for report of chest pain.  Per Dr. Merrilee Jansky note 05/09/2016, her pain was deemed to be noncardiac.  Heart cath showed angiographically normal coronaries.  Echo showed normal structure, EF 65%, no valvular abnormalities. She was advised to followup with cardiology as needed.   80-pack-year smoking history with COPD and DOE.  History of DM2, last A1c available in epic was 6.3 on 03/26/2016.  Will need day of surgery labs and evaluation.  EKG 02/22/2020: Sinus rhythm.  Rate 83. Does not appear significantly changed from tracing 04/25/16.  PFT 07/24/2016: FVC-%Pred-Pre Latest Units: % 48  FEV1-%Pred-Pre Latest Units: % 53  FEV1FVC-%Pred-Pre Latest Units: % 107  TLC % pred Latest Units: % 61  RV % pred Latest Units: % 66  DLCO unc % pred Latest Units: % 56   LHC 05/09/2016: Left Main Vessel was injected. Vessel is normal in caliber. Vessel is angiographically normal. Left Anterior Descending Vessel was injected. Vessel is normal in caliber. Vessel is angiographically normal. There are multiple small branch vessels that appear angiographically normal. Ramus Intermedius Vessel was injected. Vessel is normal in caliber. Vessel is angiographically normal. Left Circumflex Vessel was injected. Vessel is large. Vessel is angiographically normal. Right Coronary Artery Vessel was injected. Vessel is small. Vessel is angiographically normal. Osteal and proximal area had catheter induced spasm.  TTE 03/27/2016: - Left ventricle: The cavity size was normal. Systolic function was  normal. The estimated ejection fraction was in the range of 60%  to 65%. Wall motion was normal; there were no regional wall  motion abnormalities.  - Left atrium: The atrium was mildly dilated.  - Right atrium: The atrium was mildly dilated.   Wynonia Musty [facility] Short Stay Center/Anesthesiology Phone [phone] 09/19/2020  9:50 AM

## 2020-09-21 ENCOUNTER — Other Ambulatory Visit: Payer: Self-pay | Admitting: Neurological Surgery

## 2020-10-01 ENCOUNTER — Other Ambulatory Visit (HOSPITAL_COMMUNITY)
Admission: RE | Admit: 2020-10-01 | Discharge: 2020-10-01 | Disposition: A | Discharge status: Home / Self Care | Payer: Medicaid Other | Source: Ambulatory Visit | Attending: Neurological Surgery | Admitting: Neurological Surgery

## 2020-10-01 ENCOUNTER — Encounter (HOSPITAL_COMMUNITY): Payer: Self-pay | Admitting: Neurological Surgery

## 2020-10-01 DIAGNOSIS — Z20822 Contact with and (suspected) exposure to covid-19: Secondary | ICD-10-CM | POA: Insufficient documentation

## 2020-10-01 DIAGNOSIS — Z01818 Encounter for other preprocedural examination: Secondary | ICD-10-CM | POA: Insufficient documentation

## 2020-10-01 LAB — SARS CORONAVIRUS 2 (TAT 6-24 HRS): SARS Coronavirus 2: NEGATIVE

## 2020-10-01 NOTE — Progress Notes (Addendum)
Spoke with pt for pre-op call. Pt states she saw Dr. Doylene Canard in 2017 for chest pain, no longer sees him. See Karoline Caldwell, PA note.   Pt states she was pre-diabetic, but states she no longer has to have an A1C done. The last A1C in Epic was done in 2017 and it was 6.3  Covid test done today, pt states she is in quarantine and will stay in it until she comes for surgery in the morning.

## 2020-10-02 ENCOUNTER — Ambulatory Visit (HOSPITAL_COMMUNITY)
Admission: RE | Admit: 2020-10-02 | Discharge: 2020-10-02 | Disposition: A | Discharge status: Home / Self Care | Payer: Medicaid Other | Attending: Neurological Surgery | Admitting: Neurological Surgery

## 2020-10-02 ENCOUNTER — Encounter (HOSPITAL_COMMUNITY)
Admission: RE | Disposition: A | Discharge status: Home / Self Care | Payer: Self-pay | Source: Home / Self Care | Attending: Neurological Surgery

## 2020-10-02 ENCOUNTER — Encounter (HOSPITAL_COMMUNITY): Payer: Self-pay | Admitting: Neurological Surgery

## 2020-10-02 ENCOUNTER — Ambulatory Visit (HOSPITAL_COMMUNITY): Payer: Medicaid Other

## 2020-10-02 ENCOUNTER — Other Ambulatory Visit: Payer: Self-pay

## 2020-10-02 ENCOUNTER — Ambulatory Visit (HOSPITAL_COMMUNITY): Payer: Medicaid Other | Admitting: Physician Assistant

## 2020-10-02 DIAGNOSIS — R0789 Other chest pain: Secondary | ICD-10-CM | POA: Insufficient documentation

## 2020-10-02 DIAGNOSIS — Z791 Long term (current) use of non-steroidal anti-inflammatories (NSAID): Secondary | ICD-10-CM | POA: Diagnosis not present

## 2020-10-02 DIAGNOSIS — M5117 Intervertebral disc disorders with radiculopathy, lumbosacral region: Secondary | ICD-10-CM | POA: Diagnosis present

## 2020-10-02 DIAGNOSIS — Z419 Encounter for procedure for purposes other than remedying health state, unspecified: Secondary | ICD-10-CM

## 2020-10-02 DIAGNOSIS — Z79899 Other long term (current) drug therapy: Secondary | ICD-10-CM | POA: Insufficient documentation

## 2020-10-02 DIAGNOSIS — F17210 Nicotine dependence, cigarettes, uncomplicated: Secondary | ICD-10-CM | POA: Diagnosis not present

## 2020-10-02 DIAGNOSIS — Z79891 Long term (current) use of opiate analgesic: Secondary | ICD-10-CM | POA: Insufficient documentation

## 2020-10-02 HISTORY — PX: LUMBAR LAMINECTOMY/ DECOMPRESSION WITH MET-RX: SHX5959

## 2020-10-02 HISTORY — DX: Other constipation: K59.09

## 2020-10-02 HISTORY — DX: Prediabetes: R73.03

## 2020-10-02 LAB — CBC
HCT: 50.2 % — ABNORMAL HIGH (ref 36.0–46.0)
Hemoglobin: 16 g/dL — ABNORMAL HIGH (ref 12.0–15.0)
MCH: 30 pg (ref 26.0–34.0)
MCHC: 31.9 g/dL (ref 30.0–36.0)
MCV: 94.2 fL (ref 80.0–100.0)
Platelets: 168 10*3/uL (ref 150–400)
RBC: 5.33 MIL/uL — ABNORMAL HIGH (ref 3.87–5.11)
RDW: 12.7 % (ref 11.5–15.5)
WBC: 5.3 10*3/uL (ref 4.0–10.5)
nRBC: 0 % (ref 0.0–0.2)

## 2020-10-02 LAB — SURGICAL PCR SCREEN
MRSA, PCR: NEGATIVE
Staphylococcus aureus: NEGATIVE

## 2020-10-02 LAB — GLUCOSE, CAPILLARY: Glucose-Capillary: 143 mg/dL — ABNORMAL HIGH (ref 70–99)

## 2020-10-02 LAB — POCT I-STAT, CHEM 8
BUN: 16 mg/dL (ref 6–20)
Calcium, Ion: 1.12 mmol/L — ABNORMAL LOW (ref 1.15–1.40)
Chloride: 105 mmol/L (ref 98–111)
Creatinine, Ser: 0.8 mg/dL (ref 0.44–1.00)
Glucose, Bld: 100 mg/dL — ABNORMAL HIGH (ref 70–99)
HCT: 50 % — ABNORMAL HIGH (ref 36.0–46.0)
Hemoglobin: 17 g/dL — ABNORMAL HIGH (ref 12.0–15.0)
Potassium: 4.4 mmol/L (ref 3.5–5.1)
Sodium: 140 mmol/L (ref 135–145)
TCO2: 27 mmol/L (ref 22–32)

## 2020-10-02 LAB — PROTIME-INR
INR: 1 (ref 0.8–1.2)
Prothrombin Time: 12.8 seconds (ref 11.4–15.2)

## 2020-10-02 LAB — POCT PREGNANCY, URINE: Preg Test, Ur: NEGATIVE

## 2020-10-02 SURGERY — LUMBAR LAMINECTOMY/ DECOMPRESSION WITH MET-RX
Anesthesia: General | Laterality: Right

## 2020-10-02 MED ORDER — MIDAZOLAM HCL 5 MG/5ML IJ SOLN
INTRAMUSCULAR | Status: DC | PRN
  Administered 2020-10-02: 2 mg via INTRAVENOUS

## 2020-10-02 MED ORDER — GABAPENTIN 100 MG PO CAPS: 100.0000 mg | ORAL_CAPSULE | Freq: Three times a day (TID) | ORAL | 1 refills | Status: DC

## 2020-10-02 MED ORDER — EPHEDRINE SULFATE-NACL 50-0.9 MG/10ML-% IV SOSY
PREFILLED_SYRINGE | INTRAVENOUS | Status: DC | PRN
  Administered 2020-10-02 (×2): 5 mg via INTRAVENOUS
  Administered 2020-10-02 (×2): 10 mg via INTRAVENOUS

## 2020-10-02 MED ORDER — METHOCARBAMOL 500 MG PO TABS: 500.0000 mg | ORAL_TABLET | Freq: Three times a day (TID) | ORAL | 1 refills | Status: AC | PRN

## 2020-10-02 MED ORDER — ONDANSETRON HCL 4 MG/2ML IJ SOLN: 4.0000 mg | Freq: Four times a day (QID) | INTRAMUSCULAR | Status: DC | PRN

## 2020-10-02 MED ORDER — LIDOCAINE-EPINEPHRINE 1 %-1:100000 IJ SOLN
INTRAMUSCULAR | Status: AC
  Filled 2020-10-02: qty 1

## 2020-10-02 MED ORDER — FENTANYL CITRATE (PF) 250 MCG/5ML IJ SOLN
INTRAMUSCULAR | Status: AC
  Filled 2020-10-02: qty 5

## 2020-10-02 MED ORDER — ONDANSETRON HCL 4 MG/2ML IJ SOLN
INTRAMUSCULAR | Status: DC | PRN
  Administered 2020-10-02: 4 mg via INTRAVENOUS

## 2020-10-02 MED ORDER — SUGAMMADEX SODIUM 200 MG/2ML IV SOLN
INTRAVENOUS | Status: DC | PRN
  Administered 2020-10-02: 200 mg via INTRAVENOUS

## 2020-10-02 MED ORDER — PROPOFOL 10 MG/ML IV BOLUS
INTRAVENOUS | Status: AC
  Filled 2020-10-02: qty 20

## 2020-10-02 MED ORDER — ROCURONIUM BROMIDE 10 MG/ML (PF) SYRINGE
PREFILLED_SYRINGE | INTRAVENOUS | Status: AC
  Filled 2020-10-02: qty 10

## 2020-10-02 MED ORDER — BUPIVACAINE-EPINEPHRINE 0.5% -1:200000 IJ SOLN
INTRAMUSCULAR | Status: AC
  Filled 2020-10-02: qty 1

## 2020-10-02 MED ORDER — MIDAZOLAM HCL 2 MG/2ML IJ SOLN
INTRAMUSCULAR | Status: AC
  Filled 2020-10-02: qty 2

## 2020-10-02 MED ORDER — METHOCARBAMOL 500 MG PO TABS: 500.0000 mg | ORAL_TABLET | Freq: Three times a day (TID) | ORAL | 1 refills | Status: DC | PRN

## 2020-10-02 MED ORDER — OXYCODONE HCL 5 MG PO TABS
5.0000 mg | ORAL_TABLET | Freq: Once | ORAL | Status: AC | PRN
  Administered 2020-10-02: 5 mg via ORAL

## 2020-10-02 MED ORDER — LIDOCAINE 2% (20 MG/ML) 5 ML SYRINGE
INTRAMUSCULAR | Status: DC | PRN
  Administered 2020-10-02: 50 mg via INTRAVENOUS

## 2020-10-02 MED ORDER — BUPIVACAINE-EPINEPHRINE (PF) 0.5% -1:200000 IJ SOLN
INTRAMUSCULAR | Status: DC | PRN
  Administered 2020-10-02: 15 mL via PERINEURAL

## 2020-10-02 MED ORDER — ORAL CARE MOUTH RINSE: 15.0000 mL | Freq: Once | OROMUCOSAL | Status: AC

## 2020-10-02 MED ORDER — CEFAZOLIN SODIUM-DEXTROSE 2-4 GM/100ML-% IV SOLN
2.0000 g | INTRAVENOUS | Status: AC
  Administered 2020-10-02: 2 g via INTRAVENOUS

## 2020-10-02 MED ORDER — DEXAMETHASONE SODIUM PHOSPHATE 10 MG/ML IJ SOLN
INTRAMUSCULAR | Status: AC
  Filled 2020-10-02: qty 1

## 2020-10-02 MED ORDER — KETAMINE HCL 50 MG/5ML IJ SOSY
PREFILLED_SYRINGE | INTRAMUSCULAR | Status: AC
  Filled 2020-10-02: qty 5

## 2020-10-02 MED ORDER — FENTANYL CITRATE (PF) 100 MCG/2ML IJ SOLN
25.0000 ug | INTRAMUSCULAR | Status: DC | PRN
  Administered 2020-10-02 (×3): 50 ug via INTRAVENOUS

## 2020-10-02 MED ORDER — CHLORHEXIDINE GLUCONATE CLOTH 2 % EX PADS: 6.0000 | MEDICATED_PAD | Freq: Once | CUTANEOUS | Status: DC

## 2020-10-02 MED ORDER — KETAMINE HCL 10 MG/ML IJ SOLN
INTRAMUSCULAR | Status: DC | PRN
  Administered 2020-10-02: 40 mg via INTRAVENOUS

## 2020-10-02 MED ORDER — FENTANYL CITRATE (PF) 100 MCG/2ML IJ SOLN
INTRAMUSCULAR | Status: AC
  Filled 2020-10-02: qty 2

## 2020-10-02 MED ORDER — THROMBIN 5000 UNITS EX SOLR
OROMUCOSAL | Status: DC | PRN
  Administered 2020-10-02: 5 mL via TOPICAL

## 2020-10-02 MED ORDER — LACTATED RINGERS IV SOLN: INTRAVENOUS | Status: DC | PRN

## 2020-10-02 MED ORDER — CHLORHEXIDINE GLUCONATE 0.12 % MT SOLN
15.0000 mL | Freq: Once | OROMUCOSAL | Status: AC
  Administered 2020-10-02: 15 mL via OROMUCOSAL

## 2020-10-02 MED ORDER — DEXAMETHASONE SODIUM PHOSPHATE 10 MG/ML IJ SOLN
INTRAMUSCULAR | Status: DC | PRN
  Administered 2020-10-02: 5 mg via INTRAVENOUS

## 2020-10-02 MED ORDER — ROCURONIUM BROMIDE 10 MG/ML (PF) SYRINGE
PREFILLED_SYRINGE | INTRAVENOUS | Status: DC | PRN
  Administered 2020-10-02: 60 mg via INTRAVENOUS

## 2020-10-02 MED ORDER — LIDOCAINE-EPINEPHRINE 1 %-1:100000 IJ SOLN
INTRAMUSCULAR | Status: DC | PRN
  Administered 2020-10-02: 15 mL

## 2020-10-02 MED ORDER — FENTANYL CITRATE (PF) 250 MCG/5ML IJ SOLN
INTRAMUSCULAR | Status: DC | PRN
  Administered 2020-10-02: 100 ug via INTRAVENOUS
  Administered 2020-10-02 (×3): 50 ug via INTRAVENOUS

## 2020-10-02 MED ORDER — PROPOFOL 10 MG/ML IV BOLUS
INTRAVENOUS | Status: DC | PRN
  Administered 2020-10-02: 200 mg via INTRAVENOUS
  Administered 2020-10-02: 30 mg via INTRAVENOUS

## 2020-10-02 MED ORDER — OXYCODONE HCL 5 MG/5ML PO SOLN: 5.0000 mg | Freq: Once | ORAL | Status: AC | PRN

## 2020-10-02 MED ORDER — ONDANSETRON HCL 4 MG/2ML IJ SOLN
INTRAMUSCULAR | Status: AC
  Filled 2020-10-02: qty 2

## 2020-10-02 MED ORDER — LACTATED RINGERS IV SOLN: INTRAVENOUS | Status: DC

## 2020-10-02 MED ORDER — DEXMEDETOMIDINE (PRECEDEX) IN NS 20 MCG/5ML (4 MCG/ML) IV SYRINGE
PREFILLED_SYRINGE | INTRAVENOUS | Status: DC | PRN
  Administered 2020-10-02: 20 ug via INTRAVENOUS

## 2020-10-02 MED ORDER — LIDOCAINE 2% (20 MG/ML) 5 ML SYRINGE
INTRAMUSCULAR | Status: AC
  Filled 2020-10-02: qty 5

## 2020-10-02 MED ORDER — 0.9 % SODIUM CHLORIDE (POUR BTL) OPTIME
TOPICAL | Status: DC | PRN
  Administered 2020-10-02: 1000 mL

## 2020-10-02 MED ORDER — EPHEDRINE 5 MG/ML INJ
INTRAVENOUS | Status: AC
  Filled 2020-10-02: qty 10

## 2020-10-02 MED ORDER — OXYCODONE HCL 5 MG PO TABS
ORAL_TABLET | ORAL | Status: AC
  Filled 2020-10-02: qty 1

## 2020-10-02 MED ORDER — THROMBIN 5000 UNITS EX SOLR
CUTANEOUS | Status: AC
  Filled 2020-10-02: qty 5000

## 2020-10-02 MED ORDER — HEMOSTATIC AGENTS (NO CHARGE) OPTIME
TOPICAL | Status: DC | PRN
  Administered 2020-10-02: 1 via TOPICAL

## 2020-10-02 SURGICAL SUPPLY — 62 items
ADH SKN CLS APL DERMABOND .7 (GAUZE/BANDAGES/DRESSINGS) ×1
BAND INSRT 18 STRL LF DISP RB (MISCELLANEOUS) ×2
BAND RUBBER #18 3X1/16 STRL (MISCELLANEOUS) ×4 IMPLANT
BLADE SURG 11 STRL SS (BLADE) ×2 IMPLANT
BUR MATCHSTICK NEURO 3.0 LAGG (BURR) ×2 IMPLANT
CARTRIDGE OIL MAESTRO DRILL (MISCELLANEOUS) ×1 IMPLANT
CNTNR URN SCR LID CUP LEK RST (MISCELLANEOUS) IMPLANT
CONT SPEC 4OZ STRL OR WHT (MISCELLANEOUS)
COVER BACK TABLE 60X90IN (DRAPES) ×2 IMPLANT
COVER MAYO STAND STRL (DRAPES) ×2 IMPLANT
COVER WAND RF STERILE (DRAPES) ×2 IMPLANT
DECANTER SPIKE VIAL GLASS SM (MISCELLANEOUS) ×2 IMPLANT
DERMABOND ADVANCED (GAUZE/BANDAGES/DRESSINGS) ×1
DERMABOND ADVANCED .7 DNX12 (GAUZE/BANDAGES/DRESSINGS) IMPLANT
DIFFUSER DRILL AIR PNEUMATIC (MISCELLANEOUS) ×2 IMPLANT
DRAPE C-ARM 42X72 X-RAY (DRAPES) ×2 IMPLANT
DRAPE LAPAROTOMY 100X72X124 (DRAPES) ×2 IMPLANT
DRAPE MICROSCOPE LEICA (MISCELLANEOUS) ×2 IMPLANT
DRAPE SURG 17X23 STRL (DRAPES) ×2 IMPLANT
DRSG OPSITE POSTOP 3X4 (GAUZE/BANDAGES/DRESSINGS) ×2 IMPLANT
DURAPREP 26ML APPLICATOR (WOUND CARE) ×2 IMPLANT
ELECT BLADE INSULATED 6.5IN (ELECTROSURGICAL) ×2
ELECT REM PT RETURN 9FT ADLT (ELECTROSURGICAL) ×2
ELECTRODE BLDE INSULATED 6.5IN (ELECTROSURGICAL) ×1 IMPLANT
ELECTRODE REM PT RTRN 9FT ADLT (ELECTROSURGICAL) ×1 IMPLANT
GAUZE 4X4 16PLY RFD (DISPOSABLE) IMPLANT
GAUZE SPONGE 4X4 12PLY STRL (GAUZE/BANDAGES/DRESSINGS) ×2 IMPLANT
GLOVE BIOGEL PI IND STRL 8 (GLOVE) ×2 IMPLANT
GLOVE BIOGEL PI INDICATOR 8 (GLOVE) ×2
GLOVE ECLIPSE 8.0 STRL XLNG CF (GLOVE) ×4 IMPLANT
GLOVE SURG SS PI 6.0 STRL IVOR (GLOVE) ×2 IMPLANT
GOWN STRL REUS W/ TWL LRG LVL3 (GOWN DISPOSABLE) IMPLANT
GOWN STRL REUS W/ TWL XL LVL3 (GOWN DISPOSABLE) ×1 IMPLANT
GOWN STRL REUS W/TWL 2XL LVL3 (GOWN DISPOSABLE) IMPLANT
GOWN STRL REUS W/TWL LRG LVL3 (GOWN DISPOSABLE) ×2
GOWN STRL REUS W/TWL XL LVL3 (GOWN DISPOSABLE) ×2
HEMOSTAT POWDER KIT SURGIFOAM (HEMOSTASIS) ×2 IMPLANT
KIT BASIN OR (CUSTOM PROCEDURE TRAY) ×2 IMPLANT
KIT POSITION SURG JACKSON T1 (MISCELLANEOUS) ×2 IMPLANT
KIT TURNOVER KIT B (KITS) ×2 IMPLANT
MARKER SKIN DUAL TIP RULER LAB (MISCELLANEOUS) ×2 IMPLANT
NDL SPNL 18GX3.5 QUINCKE PK (NEEDLE) ×1 IMPLANT
NEEDLE HYPO 25X1 1.5 SAFETY (NEEDLE) ×2 IMPLANT
NEEDLE SPNL 18GX3.5 QUINCKE PK (NEEDLE) ×2 IMPLANT
NS IRRIG 1000ML POUR BTL (IV SOLUTION) ×2 IMPLANT
OIL CARTRIDGE MAESTRO DRILL (MISCELLANEOUS) ×2
PACK LAMINECTOMY NEURO (CUSTOM PROCEDURE TRAY) ×2 IMPLANT
PAD ARMBOARD 7.5X6 YLW CONV (MISCELLANEOUS) IMPLANT
PATTIES SURGICAL .5 X.5 (GAUZE/BANDAGES/DRESSINGS) IMPLANT
PATTIES SURGICAL .5 X1 (DISPOSABLE) IMPLANT
PATTIES SURGICAL 1X1 (DISPOSABLE) IMPLANT
SPONGE LAP 4X18 RFD (DISPOSABLE) IMPLANT
SPONGE SURGIFOAM ABS GEL SZ50 (HEMOSTASIS) ×1 IMPLANT
STAPLER VISISTAT 35W (STAPLE) ×1 IMPLANT
SUT VIC AB 0 CT1 27 (SUTURE)
SUT VIC AB 0 CT1 27XBRD ANBCTR (SUTURE) IMPLANT
SUT VIC AB 2-0 CP2 18 (SUTURE) ×2 IMPLANT
SUT VIC AB 3-0 SH 8-18 (SUTURE) IMPLANT
TOWEL GREEN STERILE (TOWEL DISPOSABLE) ×1 IMPLANT
TOWEL GREEN STERILE FF (TOWEL DISPOSABLE) ×1 IMPLANT
TRAY FOLEY MTR SLVR 16FR STAT (SET/KITS/TRAYS/PACK) IMPLANT
WATER STERILE IRR 1000ML POUR (IV SOLUTION) ×2 IMPLANT

## 2020-10-02 NOTE — Anesthesia Postprocedure Evaluation (Signed)
Anesthesia Post Note  Patient: [name] [name]  Procedure(s) Performed: MINIMALLY INVASIVE SURGERY MICRODISCECTOMY, LUMBAR FIVE-SACRAL ONE, RIGHT (Right )     Patient location during evaluation: PACU Anesthesia Type: General Level of consciousness: awake and alert Pain management: pain level controlled Vital Signs Assessment: post-procedure vital signs reviewed and stable Respiratory status: spontaneous breathing, nonlabored ventilation, respiratory function stable and patient connected to nasal cannula oxygen Cardiovascular status: blood pressure returned to baseline and stable Postop Assessment: no apparent nausea or vomiting Anesthetic complications: no   No complications documented.  Last Vitals:  Vitals:   10/02/20 0932 10/02/20 1524  BP: (!) 147/71 (!) 145/79  Pulse: 87 94  Resp: 18 12  Temp: 36.9 C 36.4 C  SpO2: 95% 98%    Last Pain:  Vitals:   10/02/20 1535  TempSrc:   PainSc: 10-Worst pain ever                 Cecila Satcher

## 2020-10-02 NOTE — Progress Notes (Signed)
   Providing Compassionate, Quality Care - Together  NEUROSURGERY PROGRESS NOTE   S: s/e in pacu. States numbness tingling is better in RLE, gone in LLE, some posterior thigh pain, no longer foot or calf pain  O: EXAM:  BP (!) 145/79 (BP Location: Right Arm)   Pulse 94   Temp 97.6 F (36.4 C)   Resp 12   LMP 09/05/2020   SpO2 98%   Awake, alert, oriented  Speech fluent, appropriate  CNs grossly intact  5/5 BUE/BLE  SILT  Incision c/d/i  ASSESSMENT:  49 y.o. female with  1. L5-S1 HNP with BL S1 radiculopathy  S/p L5-S1 Microdiscectomy on 10/02/2020  PLAN: - dc home -gabapentin -pain meds per pain doc -f/u 3 weeks    Thank you for allowing me to participate in this patient's care.  Please do not hesitate to call with questions or concerns.   Elwin Sleight, [facility] Cell: [phone]

## 2020-10-02 NOTE — Transfer of Care (Signed)
Immediate Anesthesia Transfer of Care Note  Patient: [name] [name]  Procedure(s) Performed: MINIMALLY INVASIVE SURGERY MICRODISCECTOMY, LUMBAR FIVE-SACRAL ONE, RIGHT (Right )  Patient Location: PACU  Anesthesia Type:General  Level of Consciousness: awake, alert  and oriented  Airway & Oxygen Therapy: Patient Spontanous Breathing and Patient connected to nasal cannula oxygen  Post-op Assessment: Report given to RN and Post -op Vital signs reviewed and stable  Post vital signs: Reviewed and stable  Last Vitals:  Vitals Value Taken Time  BP 145/79 10/02/20 1524  Temp    Pulse 99 10/02/20 1525  Resp 16 10/02/20 1525  SpO2 99 % 10/02/20 1525  Vitals shown include unvalidated device data.  Last Pain:  Vitals:   10/02/20 0952  TempSrc:   PainSc: 5          Complications: No complications documented.

## 2020-10-02 NOTE — Op Note (Signed)
PREOP DIAGNOSIS: Lumbar disc herniation, right L5-S1 central and paracentral with compression of bilateral S1 nerve roots, bilateral S1 radiculopathy, right greater than left  POSTOP DIAGNOSIS: Same  PROCEDURE: 1. L5-S1 right laminectomy and microdiscectomy for decompression of nerve roots 2. Use of operating microscope 3. Use of intraoperative fluoroscopy  SURGEON: Dr. Pieter Partridge C. Margeart Allender, DO  ASSISTANT: Dr. Erline Levine, MD  ANESTHESIA: General Endotracheal  EBL: 10 cc  SPECIMENS: None  DRAINS: None  COMPLICATIONS: None  CONDITION: Hemodynamically stable  HISTORY: [name] [name] is a 49 y.o. female that presented to my office with right greater than left S1 radiculopathy.  Imaging revealed a large L5-S1 central and paracentral disc herniation compressing both S1 nerve roots.  She had right greater than left S1 radiculopathy with positive straight leg raise on both legs.  She was having to use a walker to ambulate and her ambulation due to her pain had progressively become worse.  We discussed conservative versus operative management.  Due to her significant pain making her ambulation difficult I recommended surgical intervention.  All risks and benefits were discussed and agreed upon.  PROCEDURE IN DETAIL: After informed consent was obtained and witnessed, the patient was brought to the operating room. After induction of general anesthesia, the patient was positioned on the operative table in the prone position with all pressure points meticulously padded. The skin of the low back was then prepped and draped in the usual sterile fashion.  Under fluoroscopy, the correct level, L5-S1 was identified and marked out on the skin, and after timeout was conducted, the skin was infiltrated with local anesthetic. Skin incision was then made sharply and hemostasis was achieved with electrocautery. The fascia was then incised using Bovie electrocautery and the lamina at the L5-S1 levels  was identified and dissection was carried out in the subperiosteal plane using the the Metrx dilator.  The Metrix dilators were sequentially dilated to 20 mm along the patient's right lamina, and intraoperative x-ray was taken to confirm we were at the correct level.  A 20 mm tapered Metrix tube, 7 mm in length was selected and placed over the dilators and held in place with retractor.  Again a lateral x-ray was taken to confirm placement of the tube.  The microscope was sterilely draped and brought into the field.  Using Bovie electrocautery, the medial facet at L5-S1 on the right and the right lamina were exposed from soft tissue.  Using a high-speed drill, laminotomy was completed with a partial medial facetectomy. The ligamentum flavum was then identified and removed and the lateral edge of the thecal sac was identified.  The right S1 nerve root as well as the medial thecal sac was noted to be significantly tented and elevated due to the herniated disc.  This was then traced down to identify the traversing nerve root. Dissection was then carried out superior and lateral to the nerve root to identify the disc herniation. The posterior annulus was then coagulated with bipolar and incised and using a combination of dissectors, curettes, and rongeurs, the herniated disc fragmentS was removed.  This was a very large disc fragment.  I continued using a Murphy ball dissectors and microcurette's to free up other large disc fragments in which a total of 4 large disc fragments were removed.  This included a inferiorly migrated disc fragment.  The decompression of the nerve root was confirmed using a dissector.  I could now freely dissect in the epidural plane contralaterally and there was note  to be no compression centrally or in either lateral recess.  The thecal sac was pulsatile and I could follow the right S1 nerve inferiorly and it was noted to be completely free.  Hemostasis was then secured using a combination  of morcellized Gelfoam and thrombin and bipolar electrocautery. The wound is irrigated with copious amounts of antibiotic saline irrigation. The nerve root was then covered with a long-acting steroid solution. Self-retaining retractor was then removed, and the wound is closed in layers using a 2-0 Vicryl stitches. The skin was closed using standard skin glue.  Sterile dressing was applied.  At the end of the case all sponge, needle, and instrument counts were correct. The patient was then transferred to the stretcher and taken to the postanesthesia care unit in stable hemodynamic condition.

## 2020-10-02 NOTE — Anesthesia Procedure Notes (Signed)
Procedure Name: Intubation Date/Time: 10/02/2020 1:13 PM Performed by: Gaylene Brooks, CRNA Pre-anesthesia Checklist: Patient identified, Emergency Drugs available, Suction available and Patient being monitored Patient Re-evaluated:Patient Re-evaluated prior to induction Oxygen Delivery Method: Circle System Utilized Preoxygenation: Pre-oxygenation with 100% oxygen Induction Type: IV induction Ventilation: Mask ventilation without difficulty Laryngoscope Size: Miller and 2 Grade View: Grade I Tube type: Oral Tube size: 7.0 mm Number of attempts: 1 Airway Equipment and Method: Stylet Placement Confirmation: ETT inserted through vocal cords under direct vision,  positive ETCO2 and breath sounds checked- equal and bilateral Secured at: 22 cm Tube secured with: Tape Dental Injury: Teeth and Oropharynx as per pre-operative assessment
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
## 2020-10-03 ENCOUNTER — Encounter (HOSPITAL_COMMUNITY): Payer: Self-pay | Admitting: Neurological Surgery

## 2020-10-26 ENCOUNTER — Telehealth: Payer: Self-pay | Admitting: Specialist

## 2020-10-26 NOTE — Telephone Encounter (Signed)
   [name] [name] DOB: [birth year] MRN: [id number]   RIDER WAIVER AND RELEASE OF LIABILITY  For purposes of improving physical access to our facilities, [facility] is pleased to partner with third parties to provide [place] patients or other authorized individuals the option of convenient, on-demand ground transportation services (the [employer]") through use of the technology service that enables users to request on-demand ground transportation from independent third-party providers.  By opting to use and accept these [employer], I, the undersigned, hereby agree on behalf of myself, and on behalf of any minor child using the [employer] for whom I am the parent or legal guardian, as follows:  1. Government social research officer provided to me are provided by independent third-party transportation providers who are not [employer] or employees and who are unaffiliated with [employer]. 2. [facility] is neither a transportation carrier nor a common or public carrier. 3. [facility] has no control over the quality or safety of the transportation that occurs as a result of the [employer]. 4. [facility] cannot guarantee that any third-party transportation provider will complete any arranged transportation service. 5. [facility] makes no representation, warranty, or guarantee regarding the reliability, timeliness, quality, safety, suitability, or availability of any of the Transport Services or that they will be error free. 6. I fully understand that traveling by vehicle involves risks and dangers of serious bodily injury, including permanent disability, paralysis, and death. I agree, on behalf of myself and on behalf of any minor child using the Transport Services for whom I am the parent or legal guardian, that the entire risk arising out of my use of the [employer] remains solely with me, to the maximum extent permitted under applicable law. 7. The  [employer] are provided "as is" and "as available." [facility] disclaims all representations and warranties, express, implied or statutory, not expressly set out in these terms, including the implied warranties of merchantability and fitness for a particular purpose. 8. I hereby waive and release [facility], its agents, employees, officers, directors, representatives, insurers, attorneys, assigns, successors, subsidiaries, and affiliates from any and all past, present, or future claims, demands, liabilities, actions, causes of action, or suits of any kind directly or indirectly arising from acceptance and use of the [employer]. 9. I further waive and release [facility] and its affiliates from all present and future liability and responsibility for any injury or death to persons or damages to property caused by or related to the use of the [employer]. 10. I have read this Waiver and Release of Liability, and I understand the terms used in it and their legal significance. This Waiver is freely and voluntarily given with the understanding that my right (as well as the right of any minor child for whom I am the parent or legal guardian using the [employer]) to legal recourse against  in connection with the [employer] is knowingly surrendered in return for use of these services.   I attest that I read the consent document to [name] [name], gave [name] [name] the opportunity to ask questions and answered the questions asked (if any). I affirm that [name] [name] then provided consent for she's participation in this program.     [name] [name]

## 2020-11-01 ENCOUNTER — Ambulatory Visit: Payer: Medicaid Other | Admitting: Physical Therapy

## 2020-11-02 ENCOUNTER — Encounter: Payer: Self-pay | Admitting: Physical Therapy

## 2020-11-02 ENCOUNTER — Ambulatory Visit: Payer: Medicaid Other | Attending: Neurological Surgery | Admitting: Physical Therapy

## 2020-11-02 ENCOUNTER — Other Ambulatory Visit: Payer: Self-pay

## 2020-11-02 DIAGNOSIS — G8929 Other chronic pain: Secondary | ICD-10-CM | POA: Insufficient documentation

## 2020-11-02 DIAGNOSIS — M6281 Muscle weakness (generalized): Secondary | ICD-10-CM | POA: Diagnosis present

## 2020-11-02 DIAGNOSIS — R2689 Other abnormalities of gait and mobility: Secondary | ICD-10-CM | POA: Diagnosis present

## 2020-11-02 DIAGNOSIS — M545 Low back pain, unspecified: Secondary | ICD-10-CM | POA: Insufficient documentation
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
## 2020-11-07 ENCOUNTER — Ambulatory Visit: Payer: Medicaid Other | Admitting: Rehabilitative and Restorative Service Providers"

## 2020-11-09 ENCOUNTER — Telehealth: Payer: Self-pay | Admitting: Physical Therapy

## 2020-11-09 ENCOUNTER — Ambulatory Visit: Payer: Medicaid Other | Admitting: Physical Therapy

## 2020-11-09 NOTE — Telephone Encounter (Signed)
Attempted to contact patient due to missed PT appointment. Left VM informing patient of missed appointment today and on 11/07/2020, and reminded patient of attendance policy where if she misses her next scheduled appointment then the rest of her scheduled visits would be canceled. Patient was reminded of her next scheduled PT appointment.   Hilda Blades, PT, DPT, LAT, ATC 11/09/20  2:11 PM Phone: [phone] Fax: [phone]

## 2020-11-13 ENCOUNTER — Ambulatory Visit: Payer: Medicaid Other | Admitting: Physical Therapy

## 2020-11-15 ENCOUNTER — Ambulatory Visit: Payer: Medicaid Other | Admitting: Physical Therapy

## 2020-11-20 ENCOUNTER — Encounter: Payer: Medicaid Other | Admitting: Physical Therapy

## 2020-11-22 ENCOUNTER — Encounter: Payer: Medicaid Other | Admitting: Physical Therapy

## 2021-02-18 ENCOUNTER — Other Ambulatory Visit: Payer: Self-pay | Admitting: Obstetrics & Gynecology

## 2021-02-18 DIAGNOSIS — L989 Disorder of the skin and subcutaneous tissue, unspecified: Secondary | ICD-10-CM

## 2021-03-27 ENCOUNTER — Other Ambulatory Visit: Payer: Self-pay | Admitting: Obstetrics & Gynecology

## 2021-03-27 DIAGNOSIS — R399 Unspecified symptoms and signs involving the genitourinary system: Secondary | ICD-10-CM

## 2021-05-01 ENCOUNTER — Other Ambulatory Visit: Payer: Self-pay

## 2021-05-01 ENCOUNTER — Encounter (HOSPITAL_COMMUNITY): Payer: Self-pay

## 2021-05-01 ENCOUNTER — Emergency Department (HOSPITAL_COMMUNITY)
Admission: EM | Admit: 2021-05-01 | Discharge: 2021-05-01 | Discharge status: Against Medical Advice | Payer: Medicaid Other | Attending: Emergency Medicine | Admitting: Emergency Medicine

## 2021-05-01 ENCOUNTER — Emergency Department (HOSPITAL_COMMUNITY): Payer: Medicaid Other

## 2021-05-01 DIAGNOSIS — Z5321 Procedure and treatment not carried out due to patient leaving prior to being seen by health care provider: Secondary | ICD-10-CM | POA: Diagnosis not present

## 2021-05-01 DIAGNOSIS — Z87891 Personal history of nicotine dependence: Secondary | ICD-10-CM | POA: Insufficient documentation

## 2021-05-01 DIAGNOSIS — R0602 Shortness of breath: Secondary | ICD-10-CM | POA: Diagnosis present

## 2021-05-01 DIAGNOSIS — Z20822 Contact with and (suspected) exposure to covid-19: Secondary | ICD-10-CM | POA: Diagnosis not present

## 2021-05-01 LAB — CBC WITH DIFFERENTIAL/PLATELET
Abs Immature Granulocytes: 0.05 10*3/uL (ref 0.00–0.07)
Basophils Absolute: 0 10*3/uL (ref 0.0–0.1)
Basophils Relative: 0 %
Eosinophils Absolute: 0 10*3/uL (ref 0.0–0.5)
Eosinophils Relative: 0 %
HCT: 43.7 % (ref 36.0–46.0)
Hemoglobin: 14 g/dL (ref 12.0–15.0)
Immature Granulocytes: 0 %
Lymphocytes Relative: 18 %
Lymphs Abs: 2.3 10*3/uL (ref 0.7–4.0)
MCH: 29.5 pg (ref 26.0–34.0)
MCHC: 32 g/dL (ref 30.0–36.0)
MCV: 92.2 fL (ref 80.0–100.0)
Monocytes Absolute: 1.7 10*3/uL — ABNORMAL HIGH (ref 0.1–1.0)
Monocytes Relative: 14 %
Neutro Abs: 8.4 10*3/uL — ABNORMAL HIGH (ref 1.7–7.7)
Neutrophils Relative %: 68 %
Platelets: 144 10*3/uL — ABNORMAL LOW (ref 150–400)
RBC: 4.74 MIL/uL (ref 3.87–5.11)
RDW: 13.1 % (ref 11.5–15.5)
WBC: 12.5 10*3/uL — ABNORMAL HIGH (ref 4.0–10.5)
nRBC: 0 % (ref 0.0–0.2)

## 2021-05-01 LAB — BASIC METABOLIC PANEL
Anion gap: 9 (ref 5–15)
BUN: 6 mg/dL (ref 6–20)
CO2: 27 mmol/L (ref 22–32)
Calcium: 8.6 mg/dL — ABNORMAL LOW (ref 8.9–10.3)
Chloride: 99 mmol/L (ref 98–111)
Creatinine, Ser: 0.73 mg/dL (ref 0.44–1.00)
GFR, Estimated: >60 mL/min (ref >60)
Glucose, Bld: 160 mg/dL — ABNORMAL HIGH (ref 70–99)
Potassium: 3.5 mmol/L (ref 3.5–5.1)
Sodium: 135 mmol/L (ref 135–145)

## 2021-05-01 LAB — TROPONIN I (HIGH SENSITIVITY)
Troponin I (High Sensitivity): 7 ng/L (ref <18)
Troponin I (High Sensitivity): 7 ng/L (ref <18)

## 2021-05-01 LAB — RESP PANEL BY RT-PCR (FLU A&B, COVID) ARPGX2
Influenza A by PCR: NEGATIVE
Influenza B by PCR: NEGATIVE
SARS Coronavirus 2 by RT PCR: NEGATIVE

## 2021-05-01 NOTE — ED Notes (Signed)
Registration handed this NT this patients labels and armband and stated that this patient left.

## 2021-05-01 NOTE — ED Provider Notes (Signed)
Emergency Medicine Provider Triage Evaluation Note  [name] [name] , a 50 y.o. female  was evaluated in triage.  Pt complains of sob that she states she has had for her entire life but worsened last night. She feels like she has pneumonia or pleurisy. Denies fever. She further c/o sore throat  Review of Systems  Positive: Sob, chest pain, cough, sore throat Negative: fever  Physical Exam  BP (!) 156/75 (BP Location: Left Arm)   Pulse 93   Temp 98.5 F (36.9 C) (Oral)   Resp (!) 22   SpO2 95%  Gen:   Awake, no distress   Resp:  Normal effort  MSK:   Moves extremities without difficulty  Other:  Lungs ctab, heart with rrr  Medical Decision Making  Medically screening exam initiated at 4:44 PM.  Appropriate orders placed.  [name] [name] was informed that the remainder of the evaluation will be completed by another provider, this initial triage assessment does not replace that evaluation, and the importance of remaining in the ED until their evaluation is complete.    [name] Booze, PA-C 05/01/21 1648    [name] Saver, MD 05/03/21 [phone]

## 2021-05-01 NOTE — ED Notes (Signed)
[name] [name] got blood

## 2021-05-01 NOTE — ED Triage Notes (Signed)
Patient complains of SOB x 2 days. States that she knows she has pneumonia or pleurisy. Patient states that she has burning to back and chest since last night. No chills, no fever. Has hx of pneumonia, smoker

## 2021-05-02 ENCOUNTER — Encounter (HOSPITAL_COMMUNITY): Payer: Self-pay | Admitting: Emergency Medicine

## 2021-05-02 ENCOUNTER — Emergency Department (HOSPITAL_COMMUNITY): Payer: Medicaid Other

## 2021-05-02 ENCOUNTER — Inpatient Hospital Stay (HOSPITAL_COMMUNITY)
Admission: EM | Admit: 2021-05-02 | Discharge: 2021-05-04 | DRG: 193 | Disposition: A | Discharge status: Home / Self Care | Payer: Medicaid Other | Attending: Internal Medicine | Admitting: Internal Medicine

## 2021-05-02 DIAGNOSIS — Z888 Allergy status to other drugs, medicaments and biological substances status: Secondary | ICD-10-CM

## 2021-05-02 DIAGNOSIS — M797 Fibromyalgia: Secondary | ICD-10-CM | POA: Diagnosis present

## 2021-05-02 DIAGNOSIS — K219 Gastro-esophageal reflux disease without esophagitis: Secondary | ICD-10-CM | POA: Diagnosis present

## 2021-05-02 DIAGNOSIS — Z823 Family history of stroke: Secondary | ICD-10-CM

## 2021-05-02 DIAGNOSIS — J189 Pneumonia, unspecified organism: Principal | ICD-10-CM | POA: Diagnosis present

## 2021-05-02 DIAGNOSIS — G894 Chronic pain syndrome: Secondary | ICD-10-CM | POA: Diagnosis present

## 2021-05-02 DIAGNOSIS — Z79891 Long term (current) use of opiate analgesic: Secondary | ICD-10-CM

## 2021-05-02 DIAGNOSIS — Z981 Arthrodesis status: Secondary | ICD-10-CM

## 2021-05-02 DIAGNOSIS — J45909 Unspecified asthma, uncomplicated: Secondary | ICD-10-CM | POA: Diagnosis present

## 2021-05-02 DIAGNOSIS — E119 Type 2 diabetes mellitus without complications: Secondary | ICD-10-CM | POA: Diagnosis present

## 2021-05-02 DIAGNOSIS — Z833 Family history of diabetes mellitus: Secondary | ICD-10-CM

## 2021-05-02 DIAGNOSIS — R7303 Prediabetes: Secondary | ICD-10-CM | POA: Diagnosis present

## 2021-05-02 DIAGNOSIS — Z91012 Allergy to eggs: Secondary | ICD-10-CM

## 2021-05-02 DIAGNOSIS — E785 Hyperlipidemia, unspecified: Secondary | ICD-10-CM | POA: Diagnosis present

## 2021-05-02 DIAGNOSIS — Z6841 Body Mass Index (BMI) 40.0 and over, adult: Secondary | ICD-10-CM

## 2021-05-02 DIAGNOSIS — Z79899 Other long term (current) drug therapy: Secondary | ICD-10-CM

## 2021-05-02 DIAGNOSIS — Z91018 Allergy to other foods: Secondary | ICD-10-CM

## 2021-05-02 DIAGNOSIS — E7800 Pure hypercholesterolemia, unspecified: Secondary | ICD-10-CM | POA: Diagnosis present

## 2021-05-02 DIAGNOSIS — Z91030 Bee allergy status: Secondary | ICD-10-CM

## 2021-05-02 DIAGNOSIS — I10 Essential (primary) hypertension: Secondary | ICD-10-CM | POA: Diagnosis present

## 2021-05-02 DIAGNOSIS — F419 Anxiety disorder, unspecified: Secondary | ICD-10-CM | POA: Diagnosis present

## 2021-05-02 DIAGNOSIS — Z886 Allergy status to analgesic agent status: Secondary | ICD-10-CM

## 2021-05-02 DIAGNOSIS — Z8249 Family history of ischemic heart disease and other diseases of the circulatory system: Secondary | ICD-10-CM

## 2021-05-02 DIAGNOSIS — F17210 Nicotine dependence, cigarettes, uncomplicated: Secondary | ICD-10-CM | POA: Diagnosis present

## 2021-05-02 DIAGNOSIS — G4733 Obstructive sleep apnea (adult) (pediatric): Secondary | ICD-10-CM | POA: Diagnosis present

## 2021-05-02 DIAGNOSIS — Z87440 Personal history of urinary (tract) infections: Secondary | ICD-10-CM

## 2021-05-02 DIAGNOSIS — J9601 Acute respiratory failure with hypoxia: Secondary | ICD-10-CM | POA: Diagnosis present

## 2021-05-02 LAB — BASIC METABOLIC PANEL
Anion gap: 10 (ref 5–15)
BUN: 8 mg/dL (ref 6–20)
CO2: 27 mmol/L (ref 22–32)
Calcium: 8.8 mg/dL — ABNORMAL LOW (ref 8.9–10.3)
Chloride: 98 mmol/L (ref 98–111)
Creatinine, Ser: 0.59 mg/dL (ref 0.44–1.00)
GFR, Estimated: >60 mL/min (ref >60)
Glucose, Bld: 144 mg/dL — ABNORMAL HIGH (ref 70–99)
Potassium: 3.7 mmol/L (ref 3.5–5.1)
Sodium: 135 mmol/L (ref 135–145)

## 2021-05-02 LAB — CBC WITH DIFFERENTIAL/PLATELET
Abs Immature Granulocytes: 0.07 10*3/uL (ref 0.00–0.07)
Basophils Absolute: 0 10*3/uL (ref 0.0–0.1)
Basophils Relative: 0 %
Eosinophils Absolute: 0 10*3/uL (ref 0.0–0.5)
Eosinophils Relative: 0 %
HCT: 44.4 % (ref 36.0–46.0)
Hemoglobin: 14.1 g/dL (ref 12.0–15.0)
Immature Granulocytes: 1 %
Lymphocytes Relative: 17 %
Lymphs Abs: 2.2 10*3/uL (ref 0.7–4.0)
MCH: 29.4 pg (ref 26.0–34.0)
MCHC: 31.8 g/dL (ref 30.0–36.0)
MCV: 92.5 fL (ref 80.0–100.0)
Monocytes Absolute: 1.9 10*3/uL — ABNORMAL HIGH (ref 0.1–1.0)
Monocytes Relative: 15 %
Neutro Abs: 8.9 10*3/uL — ABNORMAL HIGH (ref 1.7–7.7)
Neutrophils Relative %: 67 %
Platelets: 165 10*3/uL (ref 150–400)
RBC: 4.8 MIL/uL (ref 3.87–5.11)
RDW: 13.3 % (ref 11.5–15.5)
WBC: 13.2 10*3/uL — ABNORMAL HIGH (ref 4.0–10.5)
nRBC: 0 % (ref 0.0–0.2)

## 2021-05-02 LAB — CBG MONITORING, ED: Glucose-Capillary: 206 mg/dL — ABNORMAL HIGH (ref 70–99)

## 2021-05-02 MED ORDER — ALBUTEROL SULFATE HFA 108 (90 BASE) MCG/ACT IN AERS: 2.0000 | INHALATION_SPRAY | RESPIRATORY_TRACT | Status: DC | PRN

## 2021-05-02 MED ORDER — SODIUM CHLORIDE 0.9 % IV SOLN
2.0000 g | INTRAVENOUS | Status: DC
  Administered 2021-05-03: 2 g via INTRAVENOUS
  Filled 2021-05-02 (×2): qty 20

## 2021-05-02 MED ORDER — ENOXAPARIN SODIUM 40 MG/0.4ML IJ SOSY
40.0000 mg | PREFILLED_SYRINGE | INTRAMUSCULAR | Status: DC
  Administered 2021-05-03 – 2021-05-04 (×2): 40 mg via SUBCUTANEOUS
  Filled 2021-05-02 (×2): qty 0.4

## 2021-05-02 MED ORDER — ACETAMINOPHEN 325 MG PO TABS
650.0000 mg | ORAL_TABLET | Freq: Once | ORAL | Status: AC
  Administered 2021-05-02: 650 mg via ORAL
  Filled 2021-05-02: qty 2

## 2021-05-02 MED ORDER — SODIUM CHLORIDE 0.9 % IV SOLN
500.0000 mg | Freq: Once | INTRAVENOUS | Status: AC
  Administered 2021-05-02: 500 mg via INTRAVENOUS
  Filled 2021-05-02: qty 500

## 2021-05-02 MED ORDER — INSULIN ASPART 100 UNIT/ML IJ SOLN
0.0000 [IU] | Freq: Every day | INTRAMUSCULAR | Status: DC
  Administered 2021-05-03: 2 [IU] via SUBCUTANEOUS
  Filled 2021-05-02: qty 0.05

## 2021-05-02 MED ORDER — INSULIN ASPART 100 UNIT/ML IJ SOLN
0.0000 [IU] | Freq: Three times a day (TID) | INTRAMUSCULAR | Status: DC
  Administered 2021-05-03: 3 [IU] via SUBCUTANEOUS
  Administered 2021-05-03: 2 [IU] via SUBCUTANEOUS
  Administered 2021-05-03 – 2021-05-04 (×2): 3 [IU] via SUBCUTANEOUS
  Filled 2021-05-02: qty 0.15

## 2021-05-02 MED ORDER — SODIUM CHLORIDE 0.9 % IV SOLN
1.0000 g | Freq: Once | INTRAVENOUS | Status: AC
  Administered 2021-05-02: 1 g via INTRAVENOUS
  Filled 2021-05-02: qty 10

## 2021-05-02 MED ORDER — DIPHENHYDRAMINE HCL 50 MG/ML IJ SOLN
25.0000 mg | Freq: Once | INTRAMUSCULAR | Status: AC
  Administered 2021-05-02: 25 mg via INTRAVENOUS
  Filled 2021-05-02: qty 1

## 2021-05-02 MED ORDER — IPRATROPIUM-ALBUTEROL 0.5-2.5 (3) MG/3ML IN SOLN
3.0000 mL | Freq: Once | RESPIRATORY_TRACT | Status: AC
  Administered 2021-05-02: 3 mL via RESPIRATORY_TRACT
  Filled 2021-05-02: qty 3

## 2021-05-02 MED ORDER — ACETAMINOPHEN 325 MG PO TABS
650.0000 mg | ORAL_TABLET | Freq: Four times a day (QID) | ORAL | Status: DC | PRN
  Administered 2021-05-03 – 2021-05-04 (×2): 650 mg via ORAL
  Filled 2021-05-02 (×2): qty 2

## 2021-05-02 MED ORDER — GABAPENTIN 100 MG PO CAPS
100.0000 mg | ORAL_CAPSULE | Freq: Three times a day (TID) | ORAL | Status: DC
  Filled 2021-05-02 (×4): qty 1

## 2021-05-02 MED ORDER — ALBUTEROL SULFATE (2.5 MG/3ML) 0.083% IN NEBU
2.5000 mg | INHALATION_SOLUTION | RESPIRATORY_TRACT | Status: DC | PRN
  Administered 2021-05-04: 2.5 mg via RESPIRATORY_TRACT
  Filled 2021-05-02: qty 3

## 2021-05-02 MED ORDER — PANTOPRAZOLE SODIUM 20 MG PO TBEC
20.0000 mg | DELAYED_RELEASE_TABLET | Freq: Every day | ORAL | Status: DC
  Administered 2021-05-03: 20 mg via ORAL
  Filled 2021-05-02: qty 1

## 2021-05-02 MED ORDER — SODIUM CHLORIDE 0.9 % IV SOLN: 500.0000 mg | INTRAVENOUS | Status: DC

## 2021-05-02 MED ORDER — MORPHINE SULFATE (PF) 4 MG/ML IV SOLN
4.0000 mg | Freq: Once | INTRAVENOUS | Status: AC
  Administered 2021-05-02: 4 mg via INTRAVENOUS
  Filled 2021-05-02: qty 1

## 2021-05-02 MED ORDER — METHOCARBAMOL 500 MG PO TABS: 500.0000 mg | ORAL_TABLET | Freq: Three times a day (TID) | ORAL | Status: DC | PRN

## 2021-05-02 MED ORDER — IOHEXOL 350 MG/ML SOLN
100.0000 mL | Freq: Once | INTRAVENOUS | Status: AC | PRN
  Administered 2021-05-02: 100 mL via INTRAVENOUS

## 2021-05-02 MED ORDER — SUMATRIPTAN 5 MG/ACT NA SOLN: 1.0000 | Freq: Two times a day (BID) | NASAL | Status: DC | PRN

## 2021-05-02 MED ORDER — ONDANSETRON HCL 4 MG/2ML IJ SOLN: 4.0000 mg | Freq: Four times a day (QID) | INTRAMUSCULAR | Status: DC | PRN

## 2021-05-02 NOTE — ED Notes (Addendum)
Pt reports burning pain with administration of Zithromax. No redness, swelling, or rash noted but pt states "it burns can you stop the medication" medication stopped, per pt request and ice pack provided for arm.  MD Zierle-Ghosh notified

## 2021-05-02 NOTE — ED Triage Notes (Signed)
Per pt, was seen yesterday for same symptoms-states pain all over, not getting better
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
## 2021-05-02 NOTE — ED Notes (Signed)
Pt ambulated in room with no assistance from this RN. Pt reports slight dizziness upon standing that resolved. Pt's oxygen saturation remained 91% while ambulating. Pt reports feeling of shortness of breath post-ambulation, after sitting back down, oxygen saturation dropped to 88% at that time. After sitting for 3 minutes, pt's oxygen saturation remained stable at 93%

## 2021-05-03 ENCOUNTER — Encounter (HOSPITAL_COMMUNITY): Payer: Self-pay | Admitting: Family Medicine

## 2021-05-03 DIAGNOSIS — G894 Chronic pain syndrome: Secondary | ICD-10-CM | POA: Diagnosis present

## 2021-05-03 DIAGNOSIS — J9601 Acute respiratory failure with hypoxia: Secondary | ICD-10-CM | POA: Diagnosis present

## 2021-05-03 DIAGNOSIS — K219 Gastro-esophageal reflux disease without esophagitis: Secondary | ICD-10-CM | POA: Diagnosis present

## 2021-05-03 DIAGNOSIS — R7303 Prediabetes: Secondary | ICD-10-CM | POA: Diagnosis present

## 2021-05-03 DIAGNOSIS — E7800 Pure hypercholesterolemia, unspecified: Secondary | ICD-10-CM | POA: Diagnosis present

## 2021-05-03 DIAGNOSIS — I10 Essential (primary) hypertension: Secondary | ICD-10-CM | POA: Diagnosis present

## 2021-05-03 DIAGNOSIS — F419 Anxiety disorder, unspecified: Secondary | ICD-10-CM | POA: Diagnosis present

## 2021-05-03 DIAGNOSIS — Z823 Family history of stroke: Secondary | ICD-10-CM | POA: Diagnosis not present

## 2021-05-03 DIAGNOSIS — Z981 Arthrodesis status: Secondary | ICD-10-CM | POA: Diagnosis not present

## 2021-05-03 DIAGNOSIS — J189 Pneumonia, unspecified organism: Secondary | ICD-10-CM | POA: Diagnosis present

## 2021-05-03 DIAGNOSIS — Z886 Allergy status to analgesic agent status: Secondary | ICD-10-CM | POA: Diagnosis not present

## 2021-05-03 DIAGNOSIS — M797 Fibromyalgia: Secondary | ICD-10-CM | POA: Diagnosis present

## 2021-05-03 DIAGNOSIS — Z87440 Personal history of urinary (tract) infections: Secondary | ICD-10-CM | POA: Diagnosis not present

## 2021-05-03 DIAGNOSIS — Z79891 Long term (current) use of opiate analgesic: Secondary | ICD-10-CM | POA: Diagnosis not present

## 2021-05-03 DIAGNOSIS — F17210 Nicotine dependence, cigarettes, uncomplicated: Secondary | ICD-10-CM | POA: Diagnosis present

## 2021-05-03 DIAGNOSIS — Z8249 Family history of ischemic heart disease and other diseases of the circulatory system: Secondary | ICD-10-CM | POA: Diagnosis not present

## 2021-05-03 DIAGNOSIS — Z833 Family history of diabetes mellitus: Secondary | ICD-10-CM | POA: Diagnosis not present

## 2021-05-03 DIAGNOSIS — E119 Type 2 diabetes mellitus without complications: Secondary | ICD-10-CM | POA: Diagnosis present

## 2021-05-03 DIAGNOSIS — J45909 Unspecified asthma, uncomplicated: Secondary | ICD-10-CM | POA: Diagnosis present

## 2021-05-03 DIAGNOSIS — Z6841 Body Mass Index (BMI) 40.0 and over, adult: Secondary | ICD-10-CM | POA: Diagnosis not present

## 2021-05-03 DIAGNOSIS — Z79899 Other long term (current) drug therapy: Secondary | ICD-10-CM | POA: Diagnosis not present

## 2021-05-03 DIAGNOSIS — E785 Hyperlipidemia, unspecified: Secondary | ICD-10-CM | POA: Diagnosis present

## 2021-05-03 DIAGNOSIS — G4733 Obstructive sleep apnea (adult) (pediatric): Secondary | ICD-10-CM | POA: Diagnosis present

## 2021-05-03 LAB — COMPREHENSIVE METABOLIC PANEL
ALT: 18 U/L (ref 0–44)
AST: 16 U/L (ref 15–41)
Albumin: 3.6 g/dL (ref 3.5–5.0)
Alkaline Phosphatase: 97 U/L (ref 38–126)
Anion gap: 7 (ref 5–15)
BUN: 9 mg/dL (ref 6–20)
CO2: 30 mmol/L (ref 22–32)
Calcium: 8.2 mg/dL — ABNORMAL LOW (ref 8.9–10.3)
Chloride: 98 mmol/L (ref 98–111)
Creatinine, Ser: 0.85 mg/dL (ref 0.44–1.00)
GFR, Estimated: >60 mL/min (ref >60)
Glucose, Bld: 232 mg/dL — ABNORMAL HIGH (ref 70–99)
Potassium: 3.7 mmol/L (ref 3.5–5.1)
Sodium: 135 mmol/L (ref 135–145)
Total Bilirubin: 0.6 mg/dL (ref 0.3–1.2)
Total Protein: 6.7 g/dL (ref 6.5–8.1)

## 2021-05-03 LAB — CBC
HCT: 40.6 % (ref 36.0–46.0)
Hemoglobin: 13 g/dL (ref 12.0–15.0)
MCH: 30.1 pg (ref 26.0–34.0)
MCHC: 32 g/dL (ref 30.0–36.0)
MCV: 94 fL (ref 80.0–100.0)
Platelets: 143 10*3/uL — ABNORMAL LOW (ref 150–400)
RBC: 4.32 MIL/uL (ref 3.87–5.11)
RDW: 13.3 % (ref 11.5–15.5)
WBC: 10.2 10*3/uL (ref 4.0–10.5)
nRBC: 0 % (ref 0.0–0.2)

## 2021-05-03 LAB — CBG MONITORING, ED
Glucose-Capillary: 183 mg/dL — ABNORMAL HIGH (ref 70–99)
Glucose-Capillary: 181 mg/dL — ABNORMAL HIGH (ref 70–99)

## 2021-05-03 LAB — HIV ANTIBODY (ROUTINE TESTING W REFLEX): HIV Screen 4th Generation wRfx: NONREACTIVE

## 2021-05-03 LAB — STREP PNEUMONIAE URINARY ANTIGEN: Strep Pneumo Urinary Antigen: NEGATIVE

## 2021-05-03 LAB — GLUCOSE, CAPILLARY
Glucose-Capillary: 148 mg/dL — ABNORMAL HIGH (ref 70–99)
Glucose-Capillary: 195 mg/dL — ABNORMAL HIGH (ref 70–99)

## 2021-05-03 MED ORDER — GUAIFENESIN 100 MG/5ML PO SOLN
5.0000 mL | ORAL | Status: DC | PRN
  Administered 2021-05-03 (×2): 100 mg via ORAL
  Filled 2021-05-03 (×2): qty 10

## 2021-05-03 MED ORDER — PHENYLEPHRINE-ACETAMINOPHEN 5-325 MG PO TABS: 2.0000 | ORAL_TABLET | Freq: Two times a day (BID) | ORAL | Status: DC | PRN

## 2021-05-03 MED ORDER — PANTOPRAZOLE SODIUM 40 MG PO TBEC
40.0000 mg | DELAYED_RELEASE_TABLET | Freq: Every day | ORAL | Status: DC
  Administered 2021-05-03 – 2021-05-04 (×2): 40 mg via ORAL
  Filled 2021-05-03 (×2): qty 1

## 2021-05-03 MED ORDER — GUAIFENESIN ER 600 MG PO TB12
600.0000 mg | ORAL_TABLET | Freq: Two times a day (BID) | ORAL | Status: DC
  Administered 2021-05-04: 600 mg via ORAL
  Filled 2021-05-03 (×3): qty 1

## 2021-05-03 MED ORDER — NICOTINE 21 MG/24HR TD PT24
21.0000 mg | MEDICATED_PATCH | Freq: Every day | TRANSDERMAL | Status: DC
  Filled 2021-05-03 (×2): qty 1

## 2021-05-03 MED ORDER — OXYCODONE HCL 5 MG PO TABS: 30.0000 mg | ORAL_TABLET | Freq: Every day | ORAL | Status: DC | PRN

## 2021-05-03 MED ORDER — OXYCODONE HCL 5 MG PO TABS
30.0000 mg | ORAL_TABLET | Freq: Every day | ORAL | Status: DC | PRN
  Administered 2021-05-03 – 2021-05-04 (×5): 30 mg via ORAL
  Filled 2021-05-03 (×5): qty 6

## 2021-05-03 MED ORDER — PSEUDOEPHEDRINE HCL 30 MG PO TABS
30.0000 mg | ORAL_TABLET | Freq: Four times a day (QID) | ORAL | Status: DC | PRN
  Administered 2021-05-03: 30 mg via ORAL
  Filled 2021-05-03 (×2): qty 1

## 2021-05-03 MED ORDER — SENNOSIDES-DOCUSATE SODIUM 8.6-50 MG PO TABS
1.0000 | ORAL_TABLET | Freq: Every day | ORAL | Status: DC
  Filled 2021-05-03: qty 1

## 2021-05-03 MED ORDER — AZITHROMYCIN 250 MG PO TABS
500.0000 mg | ORAL_TABLET | Freq: Every day | ORAL | Status: DC
  Administered 2021-05-03: 500 mg via ORAL
  Filled 2021-05-03: qty 2

## 2021-05-03 MED ORDER — DIPHENHYDRAMINE HCL 50 MG/ML IJ SOLN
25.0000 mg | Freq: Once | INTRAMUSCULAR | Status: AC
  Administered 2021-05-03: 25 mg via INTRAVENOUS
  Filled 2021-05-03: qty 1

## 2021-05-03 MED ORDER — DICLOFENAC SODIUM 1 % EX GEL
2.0000 g | Freq: Four times a day (QID) | CUTANEOUS | Status: DC | PRN
  Filled 2021-05-03: qty 100

## 2021-05-03 MED ORDER — POLYETHYLENE GLYCOL 3350 17 G PO PACK
17.0000 g | PACK | Freq: Every day | ORAL | Status: DC
  Filled 2021-05-03 (×2): qty 1

## 2021-05-03 MED ORDER — FLUTICASONE PROPIONATE 50 MCG/ACT NA SUSP
1.0000 | Freq: Every day | NASAL | Status: DC
Start: 2021-05-03 — End: 2021-05-04
  Administered 2021-05-03: 2 via NASAL
  Filled 2021-05-03: qty 16

## 2021-05-03 NOTE — Plan of Care (Signed)
  Problem: Education: Goal: Knowledge of General Education information will improve Description: Including pain rating scale, medication(s)/side effects and non-pharmacologic comfort measures Outcome: Progressing   Problem: Clinical Measurements: Goal: Will remain free from infection Outcome: Progressing Goal: Respiratory complications will improve Outcome: Progressing   Problem: Nutrition: Goal: Adequate nutrition will be maintained Outcome: Progressing   

## 2021-05-03 NOTE — ED Notes (Signed)
Lunch tray provided. 

## 2021-05-03 NOTE — ED Notes (Signed)
Warm blanket and coffee provided.

## 2021-05-03 NOTE — Progress Notes (Signed)
[facility] PROGRESS NOTE   [name] [name] KXF:[id number] DOB: Nov 03, 1971 DOA: 05/02/2021  PCP: [facility]  Brief History/Interval Summary: 50 y.o. female, with history of anxiety, psychiatric disorder, GERD, high cholesterol, prediabetes, obesity, chronic pain syndrome, presented to the ED with a chief complaint of dyspnea, burning and stabbing in her back and chest.  Patient was found to have pneumonia in the right lung.  She was noted to be hypoxic.  She was hospitalized for further management.    Reason for Visit: Community-acquired pneumonia.  Acute respiratory failure with hypoxia  Consultants: None  Procedures: None  Antibiotics: Anti-infectives (From admission, onward)    Start     Dose/Rate Route Frequency Ordered Stop   05/03/21 2200  azithromycin (ZITHROMAX) 500 mg in sodium chloride 0.9 % 250 mL IVPB  Status:  Discontinued        500 mg 250 mL/hr over 60 Minutes Intravenous Every 24 hours 05/02/21 2311 05/03/21 0722   05/03/21 2200  azithromycin (ZITHROMAX) tablet 500 mg        500 mg Oral Daily at bedtime 05/03/21 0722 05/07/21 2159   05/03/21 1800  cefTRIAXone (ROCEPHIN) 2 g in sodium chloride 0.9 % 100 mL IVPB        2 g 200 mL/hr over 30 Minutes Intravenous Every 24 hours 05/02/21 2311 05/08/21 1759   05/02/21 1800  cefTRIAXone (ROCEPHIN) 1 g in sodium chloride 0.9 % 100 mL IVPB        1 g 200 mL/hr over 30 Minutes Intravenous  Once 05/02/21 1755 05/02/21 1910   05/02/21 1800  azithromycin (ZITHROMAX) 500 mg in sodium chloride 0.9 % 250 mL IVPB        500 mg 250 mL/hr over 60 Minutes Intravenous  Once 05/02/21 1755 05/02/21 2252       Subjective/Interval History: Patient complains of generalized body aches along with cough which has been dry for the most part.  Denies any blood in the sputum.  No nausea or vomiting.  Continues to have chest pain in the right upper chest as well as pain in the right upper  back.     Assessment/Plan:  Community-acquired pneumonia/acute respiratory failure with hypoxia She was saturating in the late 80s on room air.  Currently on 2 L and saturating in the 90s.  CT scan shows significant infiltrate in the right lung.  Patient placed on ceftriaxone and azithromycin.  WBC was noted to be elevated.  Mucinex will be ordered. Influenza and COVID-19 tests were negative.  Chronic pain syndrome/fibromyalgia She showed me pill bottle of oxycodone which she takes up to 5 times a day.  It is 30 mg each.  This will be reinitiated.  Bowel regimen will be ordered.  Diabetes mellitus type 2 Metformin on hold.  SSI.  Monitor CBGs.  Tobacco use disorder Nicotine patch  History of GERD Continue PPI  Severe obesity Estimated body mass index is 40.59 kg/m as calculated from the following:   Height as of 12/03/16: 5\' 2"  (1.575 m).   Weight as of 02/10/20: 100.7 kg.   DVT Prophylaxis: Lovenox Code Status: Full code Family Communication: Discussed with the patient Disposition Plan: Hopefully return home in 2 to 3 days  Status is: Observation  The patient will require care spanning > 2 midnights and should be moved to inpatient because: Ongoing active pain requiring inpatient pain management and IV treatments appropriate due to intensity of illness or inability to take PO  Dispo: The patient is  from: Home              Anticipated d/c is to: Home              Patient currently is not medically stable to d/c.   Difficult to place patient No      Medications: Scheduled:  azithromycin  500 mg Oral QHS   enoxaparin (LOVENOX) injection  40 mg Subcutaneous Q24H   fluticasone  1-2 spray Each Nare Daily   gabapentin  100 mg Oral TID   guaiFENesin  600 mg Oral BID   insulin aspart  0-15 Units Subcutaneous TID WC   insulin aspart  0-5 Units Subcutaneous QHS   nicotine  21 mg Transdermal Daily   pantoprazole  40 mg Oral Daily   polyethylene glycol  17 g Oral Daily    senna-docusate  1 tablet Oral QHS   Continuous:  cefTRIAXone (ROCEPHIN)  IV     MGQ:[id number], albuterol, diclofenac Sodium, methocarbamol, ondansetron (ZOFRAN) IV, oxyCODONE, Phenylephrine-Acetaminophen, SUMAtriptan   Objective:  Vital Signs  Vitals:   05/03/21 0402 05/03/21 0636 05/03/21 0749 05/03/21 0905  BP: (!) 125/52 136/76  131/88  Pulse: 83 81  100  Resp: 12 16  18   Temp:   (!) 97.4 F (36.3 C)   TempSrc:   Oral   SpO2: 96% 100%  94%    Intake/Output Summary (Last 24 hours) at 05/03/2021 0955 Last data filed at 05/02/2021 2252 Gross per 24 hour  Intake 167.8 ml  Output --  Net 167.8 ml   There were no vitals filed for this visit.  General appearance: Awake alert.  In no distress.  Noted to be anxious Resp: Mildly tachypneic.  No use of accessory muscles.  Crackles on the right.  No wheezing.  Few rhonchi. Cardio: S1-S2 is normal regular.  No S3-S4.  No rubs murmurs or bruit GI: Abdomen is soft.  Nontender nondistended.  Bowel sounds are present normal.  No masses organomegaly Extremities: No edema.  Full range of motion of lower extremities. Neurologic: Alert and oriented x3.  No focal neurological deficits.    Lab Results:  Data Reviewed: I have personally reviewed following labs and imaging studies  CBC: Recent Labs  Lab 05/01/21 1745 05/02/21 1900 05/03/21 0129  WBC 12.5* 13.2* 10.2  NEUTROABS 8.4* 8.9*  --   HGB 14.0 14.1 13.0  HCT 43.7 44.4 40.6  MCV 92.2 92.5 94.0  PLT 144* 165 143*    Basic Metabolic Panel: Recent Labs  Lab 05/01/21 1745 05/02/21 1900 05/03/21 0129  NA 135 135 135  K 3.5 3.7 3.7  CL 99 98 98  CO2 27 27 30   GLUCOSE 160* 144* 232*  BUN 6 8 9   CREATININE 0.73 0.59 0.85  CALCIUM 8.6* 8.8* 8.2*    GFR: CrCl cannot be calculated (Unknown ideal weight.).  Liver Function Tests: Recent Labs  Lab 05/03/21 0129  AST 16  ALT 18  ALKPHOS 97  BILITOT 0.6  PROT 6.7  ALBUMIN 3.6     CBG: Recent Labs  Lab  05/02/21 2342 05/03/21 0747  GLUCAP 206* 183*      Recent Results (from the past 240 hour(s))  Resp Panel by RT-PCR (Flu A&B, Covid) Nasopharyngeal Swab     Status: None   Collection Time: 05/01/21  4:48 PM   Specimen: Nasopharyngeal Swab; Nasopharyngeal(NP) swabs in vial transport medium  Result Value Ref Range Status   SARS Coronavirus 2 by RT PCR NEGATIVE NEGATIVE Final  Comment: (NOTE) SARS-CoV-2 target nucleic acids are NOT DETECTED.  The SARS-CoV-2 RNA is generally detectable in upper respiratory specimens during the acute phase of infection. The lowest concentration of SARS-CoV-2 viral copies this assay can detect is 138 copies/mL. A negative result does not preclude SARS-Cov-2 infection and should not be used as the sole basis for treatment or other patient management decisions. A negative result may occur with  improper specimen collection/handling, submission of specimen other than nasopharyngeal swab, presence of viral mutation(s) within the areas targeted by this assay, and inadequate number of viral copies(<138 copies/mL). A negative result must be combined with clinical observations, patient history, and epidemiological information. The expected result is Negative.  Fact Sheet for Patients:  EntrepreneurPulse.com.au  Fact Sheet for Healthcare Providers:  IncredibleEmployment.be  This test is no t yet approved or cleared by the [place] FDA and  has been authorized for detection and/or diagnosis of SARS-CoV-2 by FDA under an Emergency Use Authorization (EUA). This EUA will remain  in effect (meaning this test can be used) for the duration of the COVID-19 declaration under Section 564(b)(1) of the Act, 21 U.S.C.section 360bbb-3(b)(1), unless the authorization is terminated  or revoked sooner.       Influenza A by PCR NEGATIVE NEGATIVE Final   Influenza B by PCR NEGATIVE NEGATIVE Final    Comment: (NOTE) The Xpert  Xpress SARS-CoV-2/FLU/RSV plus assay is intended as an aid in the diagnosis of influenza from Nasopharyngeal swab specimens and should not be used as a sole basis for treatment. Nasal washings and aspirates are unacceptable for Xpert Xpress SARS-CoV-2/FLU/RSV testing.  Fact Sheet for Patients: EntrepreneurPulse.com.au  Fact Sheet for Healthcare Providers: IncredibleEmployment.be  This test is not yet approved or cleared by the [place] FDA and has been authorized for detection and/or diagnosis of SARS-CoV-2 by FDA under an Emergency Use Authorization (EUA). This EUA will remain in effect (meaning this test can be used) for the duration of the COVID-19 declaration under Section 564(b)(1) of the Act, 21 U.S.C. section 360bbb-3(b)(1), unless the authorization is terminated or revoked.  Performed at [facility], [place] [address] [place], [place] [address]       Radiology Studies: DG Chest 2 View  Result Date: 05/01/2021 CLINICAL DATA:  Cough, shortness of breath EXAM: CHEST - 2 VIEW COMPARISON:  05/08/2016 FINDINGS: Airspace disease in the right perihilar region and upper lobe as well as right lung base concerning for pneumonia. Left lung clear. Heart is normal size. No effusions or acute bony abnormality. IMPRESSION: Right upper lobe and right basilar airspace disease concerning for pneumonia. Followup PA and lateral chest X-ray is recommended in 3-4 weeks following trial of antibiotic therapy to ensure resolution and exclude underlying malignancy. Electronically Signed   By: Rolm Baptise M.D.   On: 05/01/2021 17:30   CT Angio Chest PE W and/or Wo Contrast  Result Date: 05/02/2021 CLINICAL DATA:  Shortness of breath x2 days peer EXAM: CT ANGIOGRAPHY CHEST WITH CONTRAST TECHNIQUE: Multidetector CT imaging of the chest was performed using the standard protocol during bolus administration of intravenous contrast. Multiplanar CT image  reconstructions and MIPs were obtained to evaluate the vascular anatomy. CONTRAST:  165mL OMNIPAQUE IOHEXOL 350 MG/ML SOLN COMPARISON:  August 01, 2016 FINDINGS: Cardiovascular: There is mild calcification of the aortic arch. Satisfactory opacification of the pulmonary arteries to the segmental level. No evidence of pulmonary embolism. Normal heart size. No pericardial effusion. Mediastinum/Nodes: No enlarged mediastinal, hilar, or axillary lymph nodes. Thyroid gland,  trachea, and esophagus demonstrate no significant findings. Lungs/Pleura: Marked severity infiltrate is seen within the right upper lobe. Mild to moderate severity posterior right basilar atelectasis and/or infiltrate is also noted. There is a trace amount of pleural fluid seen on the right. No pneumothorax is identified Upper Abdomen: A stable 2.1 cm diameter cystic appearing area is seen within the posterior aspect of the liver dome. Musculoskeletal: No chest wall abnormality. No acute or significant osseous findings. Review of the MIP images confirms the above findings. IMPRESSION: 1. Marked severity right upper lobe infiltrate with mild to moderate severity posterior right basilar atelectasis and/or infiltrate. Follow-up to resolution is recommended to exclude the presence of an underlying neoplasm. Electronically Signed   By: Virgina Norfolk M.D.   On: 05/02/2021 20:21       LOS: 0 days   [facility] Pager on [email or web address]  05/03/2021, 9:55 AM

## 2021-05-03 NOTE — ED Notes (Signed)
Breakfast tray provided. 
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
## 2021-05-04 LAB — CBC
HCT: 38.2 % (ref 36.0–46.0)
Hemoglobin: 12 g/dL (ref 12.0–15.0)
MCH: 29.6 pg (ref 26.0–34.0)
MCHC: 31.4 g/dL (ref 30.0–36.0)
MCV: 94.3 fL (ref 80.0–100.0)
Platelets: 147 10*3/uL — ABNORMAL LOW (ref 150–400)
RBC: 4.05 MIL/uL (ref 3.87–5.11)
RDW: 13.2 % (ref 11.5–15.5)
WBC: 8.2 10*3/uL (ref 4.0–10.5)
nRBC: 0 % (ref 0.0–0.2)

## 2021-05-04 LAB — BASIC METABOLIC PANEL
Anion gap: 7 (ref 5–15)
BUN: 9 mg/dL (ref 6–20)
CO2: 31 mmol/L (ref 22–32)
Calcium: 8.4 mg/dL — ABNORMAL LOW (ref 8.9–10.3)
Chloride: 99 mmol/L (ref 98–111)
Creatinine, Ser: 0.75 mg/dL (ref 0.44–1.00)
GFR, Estimated: >60 mL/min (ref >60)
Glucose, Bld: 189 mg/dL — ABNORMAL HIGH (ref 70–99)
Potassium: 4.1 mmol/L (ref 3.5–5.1)
Sodium: 137 mmol/L (ref 135–145)

## 2021-05-04 LAB — HEMOGLOBIN A1C
Hgb A1c MFr Bld: 6.5 % — ABNORMAL HIGH (ref 4.8–5.6)
Mean Plasma Glucose: 140 mg/dL

## 2021-05-04 LAB — GLUCOSE, CAPILLARY
Glucose-Capillary: 157 mg/dL — ABNORMAL HIGH (ref 70–99)
Glucose-Capillary: 229 mg/dL — ABNORMAL HIGH (ref 70–99)

## 2021-05-04 LAB — LEGIONELLA PNEUMOPHILA SEROGP 1 UR AG: L. pneumophila Serogp 1 Ur Ag: NEGATIVE

## 2021-05-04 MED ORDER — GUAIFENESIN ER 600 MG PO TB12: 600.0000 mg | ORAL_TABLET | Freq: Two times a day (BID) | ORAL | 0 refills | Status: AC

## 2021-05-04 MED ORDER — PREDNISONE 20 MG PO TABS: ORAL_TABLET | ORAL | 0 refills | Status: DC

## 2021-05-04 MED ORDER — GUAIFENESIN 100 MG/5ML PO SOLN: 5.0000 mL | ORAL | 0 refills | Status: AC | PRN

## 2021-05-04 MED ORDER — CEFDINIR 300 MG PO CAPS: 300.0000 mg | ORAL_CAPSULE | Freq: Two times a day (BID) | ORAL | 0 refills | Status: AC

## 2021-05-04 MED ORDER — AZITHROMYCIN 500 MG PO TABS: 500.0000 mg | ORAL_TABLET | Freq: Every day | ORAL | 0 refills | Status: AC

## 2021-05-04 NOTE — Progress Notes (Signed)
Pt being discharged to home. Discharge instructions and medication education provided to pt.  

## 2021-05-04 NOTE — Plan of Care (Signed)
  Problem: Health Behavior/Discharge Planning: Goal: Ability to manage health-related needs will improve Outcome: Progressing   Problem: Clinical Measurements: Goal: Ability to maintain clinical measurements within normal limits will improve Outcome: Progressing Goal: Diagnostic test results will improve Outcome: Progressing Goal: Respiratory complications will improve Outcome: Progressing   Problem: Activity: Goal: Ability to tolerate increased activity will improve Outcome: Progressing

## 2021-05-04 NOTE — Discharge Summary (Signed)
[facility]  Physician Discharge Summary   Patient ID: [name] [name] MRN: [id number] DOB/AGE: Apr 02, 1971 50 y.o.  Admit date: 05/02/2021 Discharge date: 05/04/2021    PCP: [facility]  DISCHARGE DIAGNOSES:  Community-acquired pneumonia Acute respiratory failure with hypoxia due to pneumonia, resolved Chronic pain syndrome History of fibromyalgia Diabetes mellitus type 2 Severe obesity   RECOMMENDATIONS FOR OUTPATIENT FOLLOW UP: Outpatient follow-up with PCP  Home Health: None Equipment/Devices: None  CODE STATUS: Full code  DISCHARGE CONDITION: fair  Diet recommendation: As before  INITIAL HISTORY: 50 y.o. female, with history of anxiety, psychiatric disorder, GERD, high cholesterol, prediabetes, obesity, chronic pain syndrome, presented to the ED with a chief complaint of dyspnea, burning and stabbing in her back and chest.  Patient was found to have pneumonia in the right lung.  She was noted to be hypoxic.  She was hospitalized for further management.     HOSPITAL COURSE:   Community-acquired pneumonia/acute respiratory failure with hypoxia In the emergency department she was noted to be saturating in the late 80s on room air.  She was admitted on 2 L of oxygen.  Placed on ceftriaxone and azithromycin.  Influenza and COVID-19 test were negative.  Patient requested steroids as apparently she usually gets steroids whenever she gets pneumonia.  She was given a 5-day course.  Antibiotics changed over to [facility] and azithromycin was continued.  Patient requested that she be discharged today.  She was weaned off of oxygen and ambulated and she maintained her sats in the 90s.  Felt better.  She was thought to be stable for discharge.  Chronic pain syndrome/fibromyalgia Continued on her oxycodone 30 mg up to 5 times a day as needed.  Patient was placed on bowel regimen however she refused to take the MiraLAX.  Diabetes mellitus type 2  Tobacco  use disorder  History of GERD  Severe obesity Estimated body mass index is 46.25 kg/m as calculated from the following:   Height as of this encounter: 5\' 2"  (1.575 m).   Weight as of this encounter: 114.7 kg.     Okay for discharge.   PERTINENT LABS:  The results of significant diagnostics from this hospitalization (including imaging, microbiology, ancillary and laboratory) are listed below for reference.    Microbiology: Recent Results (from the past 240 hour(s))  Resp Panel by RT-PCR (Flu A&B, Covid) Nasopharyngeal Swab     Status: None   Collection Time: 05/01/21  4:48 PM   Specimen: Nasopharyngeal Swab; Nasopharyngeal(NP) swabs in vial transport medium  Result Value Ref Range Status   SARS Coronavirus 2 by RT PCR NEGATIVE NEGATIVE Final    Comment: (NOTE) SARS-CoV-2 target nucleic acids are NOT DETECTED.  The SARS-CoV-2 RNA is generally detectable in upper respiratory specimens during the acute phase of infection. The lowest concentration of SARS-CoV-2 viral copies this assay can detect is 138 copies/mL. A negative result does not preclude SARS-Cov-2 infection and should not be used as the sole basis for treatment or other patient management decisions. A negative result may occur with  improper specimen collection/handling, submission of specimen other than nasopharyngeal swab, presence of viral mutation(s) within the areas targeted by this assay, and inadequate number of viral copies(<138 copies/mL). A negative result must be combined with clinical observations, patient history, and epidemiological information. The expected result is Negative.  Fact Sheet for Patients:  EntrepreneurPulse.com.au  Fact Sheet for Healthcare Providers:  IncredibleEmployment.be  This test is no t yet approved or cleared by the [place]  FDA and  has been authorized for detection and/or diagnosis of SARS-CoV-2 by FDA under an Emergency Use  Authorization (EUA). This EUA will remain  in effect (meaning this test can be used) for the duration of the COVID-19 declaration under Section 564(b)(1) of the Act, 21 U.S.C.section 360bbb-3(b)(1), unless the authorization is terminated  or revoked sooner.       Influenza A by PCR NEGATIVE NEGATIVE Final   Influenza B by PCR NEGATIVE NEGATIVE Final    Comment: (NOTE) The Xpert Xpress SARS-CoV-2/FLU/RSV plus assay is intended as an aid in the diagnosis of influenza from Nasopharyngeal swab specimens and should not be used as a sole basis for treatment. Nasal washings and aspirates are unacceptable for Xpert Xpress SARS-CoV-2/FLU/RSV testing.  Fact Sheet for Patients: EntrepreneurPulse.com.au  Fact Sheet for Healthcare Providers: IncredibleEmployment.be  This test is not yet approved or cleared by the [place] FDA and has been authorized for detection and/or diagnosis of SARS-CoV-2 by FDA under an Emergency Use Authorization (EUA). This EUA will remain in effect (meaning this test can be used) for the duration of the COVID-19 declaration under Section 564(b)(1) of the Act, 21 U.S.C. section 360bbb-3(b)(1), unless the authorization is terminated or revoked.  Performed at [facility], [place] [address], [place], [place] [address]      Labs:  COVID-19 Labs   Lab Results  Component Value Date   SARSCOV2NAA NEGATIVE 05/01/2021   [place] NEGATIVE 10/01/2020   [place] NEGATIVE 09/19/2020      Basic Metabolic Panel: Recent Labs  Lab 05/01/21 1745 05/02/21 1900 05/03/21 0129 05/04/21 0557  NA 135 135 135 137  K 3.5 3.7 3.7 4.1  CL 99 98 98 99  CO2 27 27 30 31   GLUCOSE 160* 144* 232* 189*  BUN 6 8 9 9   CREATININE 0.73 0.59 0.85 0.75  CALCIUM 8.6* 8.8* 8.2* 8.4*   Liver Function Tests: Recent Labs  Lab 05/03/21 0129  AST 16  ALT 18  ALKPHOS 97  BILITOT 0.6  PROT 6.7  ALBUMIN 3.6   CBC: Recent Labs   Lab 05/01/21 1745 05/02/21 1900 05/03/21 0129 05/04/21 0557  WBC 12.5* 13.2* 10.2 8.2  NEUTROABS 8.4* 8.9*  --   --   HGB 14.0 14.1 13.0 12.0  HCT 43.7 44.4 40.6 38.2  MCV 92.2 92.5 94.0 94.3  PLT 144* 165 143* 147*     CBG: Recent Labs  Lab 05/03/21 1210 05/03/21 1728 05/03/21 2045 05/04/21 0737 05/04/21 1123  GLUCAP 181* 148* 195* 157* 229*     IMAGING STUDIES DG Chest 2 View  Result Date: 05/01/2021 CLINICAL DATA:  Cough, shortness of breath EXAM: CHEST - 2 VIEW COMPARISON:  05/08/2016 FINDINGS: Airspace disease in the right perihilar region and upper lobe as well as right lung base concerning for pneumonia. Left lung clear. Heart is normal size. No effusions or acute bony abnormality. IMPRESSION: Right upper lobe and right basilar airspace disease concerning for pneumonia. Followup PA and lateral chest X-ray is recommended in 3-4 weeks following trial of antibiotic therapy to ensure resolution and exclude underlying malignancy. Electronically Signed   By: Rolm Baptise M.D.   On: 05/01/2021 17:30   CT Angio Chest PE W and/or Wo Contrast  Result Date: 05/02/2021 CLINICAL DATA:  Shortness of breath x2 days peer EXAM: CT ANGIOGRAPHY CHEST WITH CONTRAST TECHNIQUE: Multidetector CT imaging of the chest was performed using the standard protocol during bolus administration of intravenous contrast. Multiplanar CT image reconstructions and MIPs were  obtained to evaluate the vascular anatomy. CONTRAST:  158mL OMNIPAQUE IOHEXOL 350 MG/ML SOLN COMPARISON:  August 01, 2016 FINDINGS: Cardiovascular: There is mild calcification of the aortic arch. Satisfactory opacification of the pulmonary arteries to the segmental level. No evidence of pulmonary embolism. Normal heart size. No pericardial effusion. Mediastinum/Nodes: No enlarged mediastinal, hilar, or axillary lymph nodes. Thyroid gland, trachea, and esophagus demonstrate no significant findings. Lungs/Pleura: Marked severity infiltrate is  seen within the right upper lobe. Mild to moderate severity posterior right basilar atelectasis and/or infiltrate is also noted. There is a trace amount of pleural fluid seen on the right. No pneumothorax is identified Upper Abdomen: A stable 2.1 cm diameter cystic appearing area is seen within the posterior aspect of the liver dome. Musculoskeletal: No chest wall abnormality. No acute or significant osseous findings. Review of the MIP images confirms the above findings. IMPRESSION: 1. Marked severity right upper lobe infiltrate with mild to moderate severity posterior right basilar atelectasis and/or infiltrate. Follow-up to resolution is recommended to exclude the presence of an underlying neoplasm. Electronically Signed   By: Virgina Norfolk M.D.   On: 05/02/2021 20:21    DISCHARGE EXAMINATION: Vitals:   05/04/21 0106 05/04/21 0435 05/04/21 0500 05/04/21 1216  BP: (!) 118/52 135/65    Pulse: 86 94    Resp: 16 16    Temp: 98.9 F (37.2 C) 99.6 F (37.6 C)    TempSrc: Oral Oral    SpO2: 90% 91%  95%  Weight:   114.7 kg   Height:   5\' 2"  (1.575 m)    See progress note from earlier today  DISPOSITION: Home  Discharge Instructions     Call MD for:  difficulty breathing, headache or visual disturbances   Complete by: As directed    Call MD for:  extreme fatigue   Complete by: As directed    Call MD for:  hives   Complete by: As directed    Call MD for:  persistant dizziness or light-headedness   Complete by: As directed    Call MD for:  persistant nausea and vomiting   Complete by: As directed    Call MD for:  severe uncontrolled pain   Complete by: As directed    Call MD for:  temperature >100.4   Complete by: As directed    Diet - low sodium heart healthy   Complete by: As directed    Discharge instructions   Complete by: As directed    Please be sure to follow-up with your PCP on Monday.  Take your medications as prescribed.  You were cared for by a hospitalist during your  hospital stay. If you have any questions about your discharge medications or the care you received while you were in the hospital after you are discharged, you can call the unit and asked to speak with the hospitalist on call if the hospitalist that took care of you is not available. Once you are discharged, your primary care physician will handle any further medical issues. Please note that NO REFILLS for any discharge medications will be authorized once you are discharged, as it is imperative that you return to your primary care physician (or establish a relationship with a primary care physician if you do not have one) for your aftercare needs so that they can reassess your need for medications and monitor your lab values. If you do not have a primary care physician, you can call [phone] for a physician referral.   Increase  activity slowly   Complete by: As directed          Allergies as of 05/04/2021       Reactions   Bee Venom Anaphylaxis   Eggs Or Egg-derived Products Anaphylaxis, Swelling, Rash   Nucynta [tapentadol] Anaphylaxis, Nausea And Vomiting, Swelling   Mushroom Extract Complex Nausea And Vomiting, Other (See Comments)   Acid reflux   Other Other (See Comments)   Can only take cortisone and prednisone, all other meds cause swelling and nausea and vomiting blood   Symbicort [budesonide-formoterol Fumarate] Other (See Comments)   Thrush    Aspirin Swelling, Rash   Vomiting blood    Nsaids Swelling, Rash   Vomiting blood         Medication List     STOP taking these medications    mupirocin ointment 2 % Commonly known as: BACTROBAN       TAKE these medications    albuterol 108 (90 Base) MCG/ACT inhaler Commonly known as: VENTOLIN HFA Inhale 2 puffs into the lungs every 4 (four) hours as needed. For shortness of breath   azithromycin 500 MG tablet Commonly known as: ZITHROMAX Take 1 tablet (500 mg total) by mouth daily for 4 days.   cefdinir 300 MG  capsule Commonly known as: OMNICEF Take 1 capsule (300 mg total) by mouth 2 (two) times daily for 5 days.   DAYQUIL PO Take 2 capsules by mouth 2 (two) times daily as needed (congestion).   diclofenac Sodium 1 % Gel Commonly known as: VOLTAREN Apply 2-4 g topically 4 (four) times daily as needed. Back pain   esomeprazole 40 MG capsule Commonly known as: NEXIUM Take 40 mg by mouth See admin instructions. 40mg  by mouth daily And 40mg  daily if needed for indigestion   fluticasone 50 MCG/ACT nasal spray Commonly known as: FLONASE Place 1-2 sprays into both nostrils daily.   guaiFENesin 600 MG 12 hr tablet Commonly known as: MUCINEX Take 1 tablet (600 mg total) by mouth 2 (two) times daily for 10 days.   guaiFENesin 100 MG/5ML Soln Commonly known as: ROBITUSSIN Take 5 mLs (100 mg total) by mouth every 4 (four) hours as needed for cough or to loosen phlegm.   methocarbamol 500 MG tablet Commonly known as: Robaxin Take 1 tablet (500 mg total) by mouth every 8 (eight) hours as needed for muscle spasms.   oxycodone 30 MG immediate release tablet Commonly known as: ROXICODONE Take 30 mg by mouth 5 (five) times daily.   predniSONE 20 MG tablet Commonly known as: DELTASONE Take 2 tablets once daily for 3 days followed by 1 tablet once daily for 3 days and then stop   Sinus Pressure + Pain 5-325 MG Tabs Generic drug: Phenylephrine-Acetaminophen Take 2 tablets by mouth 2 (two) times daily as needed (congestion).   SUMAtriptan 5 MG/ACT nasal spray Commonly known as: IMITREX Place 1 spray (5 mg total) into the nose 2 (two) times daily as needed for migraine.   Vitamin D (Ergocalciferol) 1.25 MG (50000 UNIT) Caps capsule Commonly known as: DRISDOL Take 50,000 Units by mouth once a week.       ASK your doctor about these medications    atorvastatin 10 MG tablet Commonly known as: LIPITOR Take 10 mg by mouth daily.   lamoTRIgine 25 MG tablet Commonly known as:  LAMICTAL Take 25 mg by mouth daily.   lisinopril 10 MG tablet Commonly known as: ZESTRIL Take 10 mg by mouth daily.   metFORMIN 500 MG  tablet Commonly known as: GLUCOPHAGE Take 500 mg by mouth 2 (two) times daily.          Follow-up [place]. Schedule an appointment as soon as possible for a visit in 3 day(s).   Contact information: [place] [address] [phone]                 TOTAL DISCHARGE TIME: 35 minutes  [facility] Pager on [email or web address]  05/05/2021, 10:52 AM

## 2021-05-04 NOTE — Progress Notes (Addendum)
[facility] PROGRESS NOTE   [name] [name] JJH:[id number] DOB: 08-05-1971 DOA: 05/02/2021  PCP: [facility]  Brief History/Interval Summary: 50 y.o. female, with history of anxiety, psychiatric disorder, GERD, high cholesterol, prediabetes, obesity, chronic pain syndrome, presented to the ED with a chief complaint of dyspnea, burning and stabbing in her back and chest.  Patient was found to have pneumonia in the right lung.  She was noted to be hypoxic.  She was hospitalized for further management.    Reason for Visit: Community-acquired pneumonia.  Acute respiratory failure with hypoxia  Consultants: None  Procedures: None  Antibiotics: Anti-infectives (From admission, onward)    Start     Dose/Rate Route Frequency Ordered Stop   05/03/21 2200  azithromycin (ZITHROMAX) 500 mg in sodium chloride 0.9 % 250 mL IVPB  Status:  Discontinued        500 mg 250 mL/hr over 60 Minutes Intravenous Every 24 hours 05/02/21 2311 05/03/21 0722   05/03/21 2200  azithromycin (ZITHROMAX) tablet 500 mg        500 mg Oral Daily at bedtime 05/03/21 0722 05/07/21 2159   05/03/21 1800  cefTRIAXone (ROCEPHIN) 2 g in sodium chloride 0.9 % 100 mL IVPB        2 g 200 mL/hr over 30 Minutes Intravenous Every 24 hours 05/02/21 2311 05/08/21 1759   05/02/21 1800  cefTRIAXone (ROCEPHIN) 1 g in sodium chloride 0.9 % 100 mL IVPB        1 g 200 mL/hr over 30 Minutes Intravenous  Once 05/02/21 1755 05/02/21 1910   05/02/21 1800  azithromycin (ZITHROMAX) 500 mg in sodium chloride 0.9 % 250 mL IVPB        500 mg 250 mL/hr over 60 Minutes Intravenous  Once 05/02/21 1755 05/02/21 2252       Subjective/Interval History: Patient continues to feel poorly.  Continues complain of cough with generalized body aches.  Complains of sinus congestion.  Denies any chest pain.  Most of the pain is in the upper back.      Assessment/Plan:  Community-acquired pneumonia/acute respiratory failure  with hypoxia In the emergency department she was noted to be saturating in the late 80s on room air.   Remains on 2 L by nasal cannula.  Continue to try and wean her off if possible.  Continue antibiotics in the form of ceftriaxone and azithromycin.  Mobilize.  Mucinex.  Influenza and COVID-19 test were negative.  WBC is normal today. No clear indication for steroids as there is no wheezing that is appreciated.  ADDENDUM Informed by the nurse that patient was taken off of oxygen and has ambulated and saturations have been greater than 95% on RA.  Patient wants to go home since she was feeling better.  Considering her improvement it is reasonable to discharge her home today with oral antibiotics.  Chronic pain syndrome/fibromyalgia She showed me pill bottle of oxycodone which she takes up to 5 times a day.  It is 30 mg each.  This was reinitiated.  Patient does not want laxative agents despite being told about possible side effects of narcotics including constipation.  Diabetes mellitus type 2 Metformin on hold.  SSI.  Monitor CBGs.  Tobacco use disorder Nicotine patch  History of GERD Continue PPI  Severe obesity Estimated body mass index is 46.25 kg/m as calculated from the following:   Height as of this encounter: 5\' 2"  (1.575 m).   Weight as of this encounter: 114.7 kg.  DVT Prophylaxis: Lovenox Code Status: Full code Family Communication: Discussed with the patient Disposition Plan: Hopefully return home when improved  Status is: Inpatient  Remains inpatient appropriate because:IV treatments appropriate due to intensity of illness or inability to take PO  Dispo:  Patient From: Home  Planned Disposition: Home  Medically stable for discharge: No         Medications: Scheduled:  azithromycin  500 mg Oral QHS   enoxaparin (LOVENOX) injection  40 mg Subcutaneous Q24H   fluticasone  1-2 spray Each Nare Daily   gabapentin  100 mg Oral TID   guaiFENesin  600 mg Oral  BID   insulin aspart  0-15 Units Subcutaneous TID WC   insulin aspart  0-5 Units Subcutaneous QHS   nicotine  21 mg Transdermal Daily   pantoprazole  40 mg Oral Daily   polyethylene glycol  17 g Oral Daily   senna-docusate  1 tablet Oral QHS   Continuous:  cefTRIAXone (ROCEPHIN)  IV 2 g (05/03/21 1836)   RWE:[id number], albuterol, diclofenac Sodium, guaiFENesin, methocarbamol, ondansetron (ZOFRAN) IV, oxyCODONE, pseudoephedrine, SUMAtriptan   Objective:  Vital Signs  Vitals:   05/03/21 2042 05/04/21 0106 05/04/21 0435 05/04/21 0500  BP: (!) 126/48 (!) 118/52 135/65   Pulse: 79 86 94   Resp: 20 16 16    Temp: 98.5 F (36.9 C) 98.9 F (37.2 C) 99.6 F (37.6 C)   TempSrc: Oral Oral Oral   SpO2: 95% 90% 91%   Weight:    114.7 kg  Height:    5\' 2"  (1.575 m)    Intake/Output Summary (Last 24 hours) at 05/04/2021 1052 Last data filed at 05/03/2021 1836 Gross per 24 hour  Intake 240 ml  Output --  Net 240 ml    Filed Weights   05/04/21 0500  Weight: 114.7 kg    General appearance: Awake alert.  In no distress Resp: Mildly tachypneic at rest.  Crackles heard bilaterally right more than left.  No wheezing or rhonchi. Cardio: S1-S2 is normal regular.  No S3-S4.  No rubs murmurs or bruit GI: Abdomen is soft.  Nontender nondistended.  Bowel sounds are present normal.  No masses organomegaly Extremities: No edema.  Full range of motion of lower extremities. Neurologic: Alert and oriented x3.  No focal neurological deficits.     Lab Results:  Data Reviewed: I have personally reviewed following labs and imaging studies  CBC: Recent Labs  Lab 05/01/21 1745 05/02/21 1900 05/03/21 0129 05/04/21 0557  WBC 12.5* 13.2* 10.2 8.2  NEUTROABS 8.4* 8.9*  --   --   HGB 14.0 14.1 13.0 12.0  HCT 43.7 44.4 40.6 38.2  MCV 92.2 92.5 94.0 94.3  PLT 144* 165 143* 147*     Basic Metabolic Panel: Recent Labs  Lab 05/01/21 1745 05/02/21 1900 05/03/21 0129 05/04/21 0557  NA  135 135 135 137  K 3.5 3.7 3.7 4.1  CL 99 98 98 99  CO2 27 27 30 31   GLUCOSE 160* 144* 232* 189*  BUN 6 8 9 9   CREATININE 0.73 0.59 0.85 0.75  CALCIUM 8.6* 8.8* 8.2* 8.4*     GFR: Estimated Creatinine Clearance: 98.6 mL/min (by C-G formula based on SCr of 0.75 mg/dL).  Liver Function Tests: Recent Labs  Lab 05/03/21 0129  AST 16  ALT 18  ALKPHOS 97  BILITOT 0.6  PROT 6.7  ALBUMIN 3.6      CBG: Recent Labs  Lab 05/03/21 0747 05/03/21 1210 05/03/21 1728 05/03/21 2045  05/04/21 0737  GLUCAP 183* 181* 148* 195* 157*       Recent Results (from the past 240 hour(s))  Resp Panel by RT-PCR (Flu A&B, Covid) Nasopharyngeal Swab     Status: None   Collection Time: 05/01/21  4:48 PM   Specimen: Nasopharyngeal Swab; Nasopharyngeal(NP) swabs in vial transport medium  Result Value Ref Range Status   SARS Coronavirus 2 by RT PCR NEGATIVE NEGATIVE Final    Comment: (NOTE) SARS-CoV-2 target nucleic acids are NOT DETECTED.  The SARS-CoV-2 RNA is generally detectable in upper respiratory specimens during the acute phase of infection. The lowest concentration of SARS-CoV-2 viral copies this assay can detect is 138 copies/mL. A negative result does not preclude SARS-Cov-2 infection and should not be used as the sole basis for treatment or other patient management decisions. A negative result may occur with  improper specimen collection/handling, submission of specimen other than nasopharyngeal swab, presence of viral mutation(s) within the areas targeted by this assay, and inadequate number of viral copies(<138 copies/mL). A negative result must be combined with clinical observations, patient history, and epidemiological information. The expected result is Negative.  Fact Sheet for Patients:  EntrepreneurPulse.com.au  Fact Sheet for Healthcare Providers:  IncredibleEmployment.be  This test is no t yet approved or cleared by the [place] FDA and  has been authorized for detection and/or diagnosis of SARS-CoV-2 by FDA under an Emergency Use Authorization (EUA). This EUA will remain  in effect (meaning this test can be used) for the duration of the COVID-19 declaration under Section 564(b)(1) of the Act, 21 U.S.C.section 360bbb-3(b)(1), unless the authorization is terminated  or revoked sooner.       Influenza A by PCR NEGATIVE NEGATIVE Final   Influenza B by PCR NEGATIVE NEGATIVE Final    Comment: (NOTE) The Xpert Xpress SARS-CoV-2/FLU/RSV plus assay is intended as an aid in the diagnosis of influenza from Nasopharyngeal swab specimens and should not be used as a sole basis for treatment. Nasal washings and aspirates are unacceptable for Xpert Xpress SARS-CoV-2/FLU/RSV testing.  Fact Sheet for Patients: EntrepreneurPulse.com.au  Fact Sheet for Healthcare Providers: IncredibleEmployment.be  This test is not yet approved or cleared by the [place] FDA and has been authorized for detection and/or diagnosis of SARS-CoV-2 by FDA under an Emergency Use Authorization (EUA). This EUA will remain in effect (meaning this test can be used) for the duration of the COVID-19 declaration under Section 564(b)(1) of the Act, 21 U.S.C. section 360bbb-3(b)(1), unless the authorization is terminated or revoked.  Performed at [facility], [place] [address], [place], [place] [address]        Radiology Studies: CT Angio Chest PE W and/or Wo Contrast  Result Date: 05/02/2021 CLINICAL DATA:  Shortness of breath x2 days peer EXAM: CT ANGIOGRAPHY CHEST WITH CONTRAST TECHNIQUE: Multidetector CT imaging of the chest was performed using the standard protocol during bolus administration of intravenous contrast. Multiplanar CT image reconstructions and MIPs were obtained to evaluate the vascular anatomy. CONTRAST:  153mL OMNIPAQUE IOHEXOL 350 MG/ML SOLN COMPARISON:  August 01, 2016  FINDINGS: Cardiovascular: There is mild calcification of the aortic arch. Satisfactory opacification of the pulmonary arteries to the segmental level. No evidence of pulmonary embolism. Normal heart size. No pericardial effusion. Mediastinum/Nodes: No enlarged mediastinal, hilar, or axillary lymph nodes. Thyroid gland, trachea, and esophagus demonstrate no significant findings. Lungs/Pleura: Marked severity infiltrate is seen within the right upper lobe. Mild to moderate severity posterior right basilar atelectasis and/or infiltrate is  also noted. There is a trace amount of pleural fluid seen on the right. No pneumothorax is identified Upper Abdomen: A stable 2.1 cm diameter cystic appearing area is seen within the posterior aspect of the liver dome. Musculoskeletal: No chest wall abnormality. No acute or significant osseous findings. Review of the MIP images confirms the above findings. IMPRESSION: 1. Marked severity right upper lobe infiltrate with mild to moderate severity posterior right basilar atelectasis and/or infiltrate. Follow-up to resolution is recommended to exclude the presence of an underlying neoplasm. Electronically Signed   By: Virgina Norfolk M.D.   On: 05/02/2021 20:21       LOS: 1 day   [facility] Pager on [email or web address]  05/04/2021, 10:52 AM

## 2021-10-03 ENCOUNTER — Emergency Department (HOSPITAL_COMMUNITY)
Admission: EM | Admit: 2021-10-03 | Discharge: 2021-10-03 | Disposition: A | Discharge status: Home / Self Care | Payer: Medicaid Other | Attending: Emergency Medicine | Admitting: Emergency Medicine

## 2021-10-03 ENCOUNTER — Encounter (HOSPITAL_COMMUNITY): Payer: Self-pay | Admitting: Oncology

## 2021-10-03 ENCOUNTER — Other Ambulatory Visit: Payer: Self-pay

## 2021-10-03 DIAGNOSIS — B3731 Acute candidiasis of vulva and vagina: Secondary | ICD-10-CM | POA: Insufficient documentation

## 2021-10-03 DIAGNOSIS — F17210 Nicotine dependence, cigarettes, uncomplicated: Secondary | ICD-10-CM | POA: Diagnosis not present

## 2021-10-03 DIAGNOSIS — Z7984 Long term (current) use of oral hypoglycemic drugs: Secondary | ICD-10-CM | POA: Insufficient documentation

## 2021-10-03 DIAGNOSIS — J45909 Unspecified asthma, uncomplicated: Secondary | ICD-10-CM | POA: Diagnosis not present

## 2021-10-03 DIAGNOSIS — Z7952 Long term (current) use of systemic steroids: Secondary | ICD-10-CM | POA: Diagnosis not present

## 2021-10-03 DIAGNOSIS — I10 Essential (primary) hypertension: Secondary | ICD-10-CM | POA: Diagnosis not present

## 2021-10-03 DIAGNOSIS — N898 Other specified noninflammatory disorders of vagina: Secondary | ICD-10-CM | POA: Diagnosis present

## 2021-10-03 LAB — WET PREP, GENITAL
Clue Cells Wet Prep HPF POC: NONE SEEN
Sperm: NONE SEEN
Trich, Wet Prep: NONE SEEN
WBC, Wet Prep HPF POC: NONE SEEN
Yeast Wet Prep HPF POC: NONE SEEN

## 2021-10-03 MED ORDER — FLUCONAZOLE 150 MG PO TABS
150.0000 mg | ORAL_TABLET | Freq: Once | ORAL | Status: AC
  Administered 2021-10-03: 150 mg via ORAL
  Filled 2021-10-03: qty 1

## 2021-10-03 MED ORDER — FLUCONAZOLE 150 MG PO TABS: 150.0000 mg | ORAL_TABLET | Freq: Once | ORAL | 0 refills | Status: AC
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
## 2021-10-03 NOTE — ED Triage Notes (Signed)
Pt reports vaginal itching and swelling x 2 weeks. Pt states she had an article of clothing washed w/ power wash.  Pt believes this to be an allergic reaction.

## 2021-10-04 LAB — GC/CHLAMYDIA PROBE AMP (~~LOC~~) NOT AT ARMC
Chlamydia: NEGATIVE
Comment: NEGATIVE
Comment: NORMAL
Neisseria Gonorrhea: NEGATIVE

## 2022-01-02 ENCOUNTER — Other Ambulatory Visit: Payer: Self-pay | Admitting: Physician Assistant

## 2022-01-02 DIAGNOSIS — N6320 Unspecified lump in the left breast, unspecified quadrant: Secondary | ICD-10-CM

## 2022-01-02 DIAGNOSIS — N6310 Unspecified lump in the right breast, unspecified quadrant: Secondary | ICD-10-CM

## 2022-01-08 ENCOUNTER — Ambulatory Visit
Admission: RE | Admit: 2022-01-08 | Discharge: 2022-01-08 | Disposition: A | Discharge status: Home / Self Care | Payer: Medicaid Other | Source: Ambulatory Visit | Attending: Physician Assistant | Admitting: Physician Assistant

## 2022-01-08 ENCOUNTER — Ambulatory Visit: Payer: Medicaid Other

## 2022-01-08 DIAGNOSIS — N6310 Unspecified lump in the right breast, unspecified quadrant: Secondary | ICD-10-CM

## 2022-01-10 ENCOUNTER — Telehealth: Payer: Self-pay

## 2022-01-10 NOTE — Telephone Encounter (Signed)
Patient left VM on nurse line saying she has been calling the office for multiple days to schedule appt but has been unable to reach office. Call routed to front office to schedule.

## 2022-01-21 ENCOUNTER — Ambulatory Visit: Payer: Medicaid Other

## 2022-01-21 ENCOUNTER — Other Ambulatory Visit: Payer: Self-pay

## 2022-01-21 ENCOUNTER — Ambulatory Visit
Admission: RE | Admit: 2022-01-21 | Discharge: 2022-01-21 | Disposition: A | Discharge status: Home / Self Care | Payer: Medicaid Other | Source: Ambulatory Visit | Attending: Physician Assistant | Admitting: Physician Assistant

## 2022-01-21 DIAGNOSIS — N6320 Unspecified lump in the left breast, unspecified quadrant: Secondary | ICD-10-CM

## 2022-01-28 ENCOUNTER — Other Ambulatory Visit (HOSPITAL_COMMUNITY)
Admission: RE | Admit: 2022-01-28 | Discharge: 2022-01-28 | Disposition: A | Discharge status: Home / Self Care | Payer: Medicaid Other | Source: Ambulatory Visit | Attending: Family Medicine | Admitting: Family Medicine

## 2022-01-28 ENCOUNTER — Ambulatory Visit: Payer: Medicaid Other

## 2022-01-28 ENCOUNTER — Ambulatory Visit (INDEPENDENT_AMBULATORY_CARE_PROVIDER_SITE_OTHER): Payer: Medicaid Other | Admitting: Family Medicine

## 2022-01-28 ENCOUNTER — Other Ambulatory Visit: Payer: Self-pay

## 2022-01-28 ENCOUNTER — Encounter: Payer: Self-pay | Admitting: Family Medicine

## 2022-01-28 VITALS — BP 134/72 | HR 84 | Wt 228.4 lb

## 2022-01-28 DIAGNOSIS — Z5941 Food insecurity: Secondary | ICD-10-CM

## 2022-01-28 DIAGNOSIS — R300 Dysuria: Secondary | ICD-10-CM

## 2022-01-28 DIAGNOSIS — E1159 Type 2 diabetes mellitus with other circulatory complications: Secondary | ICD-10-CM

## 2022-01-28 DIAGNOSIS — B3731 Acute candidiasis of vulva and vagina: Secondary | ICD-10-CM

## 2022-01-28 DIAGNOSIS — N898 Other specified noninflammatory disorders of vagina: Secondary | ICD-10-CM

## 2022-01-28 DIAGNOSIS — Z780 Asymptomatic menopausal state: Secondary | ICD-10-CM | POA: Insufficient documentation

## 2022-01-28 DIAGNOSIS — E119 Type 2 diabetes mellitus without complications: Secondary | ICD-10-CM | POA: Insufficient documentation

## 2022-01-28 LAB — POCT URINALYSIS DIP (DEVICE)
Bilirubin Urine: NEGATIVE
Glucose, UA: 500 mg/dL — AB
Hgb urine dipstick: NEGATIVE
Ketones, ur: NEGATIVE mg/dL
Leukocytes,Ua: NEGATIVE
Nitrite: NEGATIVE
Protein, ur: NEGATIVE mg/dL
Specific Gravity, Urine: 1.025 (ref 1.005–1.030)
Urobilinogen, UA: 0.2 mg/dL (ref 0.0–1.0)
pH: 5.5 (ref 5.0–8.0)

## 2022-01-28 MED ORDER — PHENAZOPYRIDINE HCL 200 MG PO TABS: 200.0000 mg | ORAL_TABLET | Freq: Three times a day (TID) | ORAL | 0 refills | Status: AC | PRN

## 2022-01-28 MED ORDER — FLUCONAZOLE 150 MG PO TABS: ORAL_TABLET | ORAL | 0 refills | Status: DC

## 2022-01-28 NOTE — Progress Notes (Signed)
Here for nurse visit with complaint of severe vaginal itching. Patient states in December 2022 she had a reaction from a washcloth with "dawn power wash" being added to her laundry. States her vagina turned purple and "blowed up." Reports taking Benadryl and eventually was seen at hospital for this. Patient was given Diflucan to take weekly for 2 doses. The irritation returned 2 weeks later and patient was treated for a yeast infection by [facility].  ? ?Patient reports recurrent yeast infections since that time. Was most recently seen yesterday by [facility]. Patient reports thrush in mouth, yeast in groin, and yeast under breasts. States vaginal irritation is so severe that she has bleeding at times. Is using Desitin to keep the area dry. Severe burning with urination. Offered patient vaginal swab and treatment per protocol. Patient does not want to take Diflucan again since it has not fully cleared infection in the past. Requested provider appt from front office. Dione Plover, MD willing to see patient today if she can wait. Reviewed with patient who states she will wait.  ? ?Urine sample collected and UA completed; 500 mg/dL glucose present. Vaginal self swab instructions given and specimen obtained. Pt transferred to provider room to undress for exam. Vitals to be completed by Shelda Pal.  ? Apolonio Schneiders RN ?01/28/22 ?

## 2022-01-28 NOTE — Assessment & Plan Note (Signed)
At end of visit asked to address this, reviewed that we do not have time today to address but we will schedule follow up.  ?

## 2022-01-28 NOTE — Patient Instructions (Addendum)
Return for [employer] between 9 and 4 PM ?

## 2022-01-28 NOTE — Assessment & Plan Note (Signed)
Significant vulvovaginal candidiasis. We discussed at length how ultimately her underlying problem is her DM2 which is fueling her recurrent infections. Stressed that without treating this root cause she will likely continue to have candidal infections. Recommended she do diflucan '150mg'$  q72h for three doses followed by weekly prophylaxis. She is in agreement with this plan.  ?
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
## 2022-01-28 NOTE — Assessment & Plan Note (Signed)
At patient's request referred to DM educator for further education.  ?

## 2022-01-29 LAB — CERVICOVAGINAL ANCILLARY ONLY
Bacterial Vaginitis (gardnerella): NEGATIVE
Candida Glabrata: POSITIVE — AB
Candida Vaginitis: POSITIVE — AB
Chlamydia: NEGATIVE
Comment: NEGATIVE
Comment: NEGATIVE
Comment: NEGATIVE
Comment: NEGATIVE
Comment: NEGATIVE
Comment: NORMAL
Neisseria Gonorrhea: NEGATIVE
Trichomonas: NEGATIVE

## 2022-01-29 MED ORDER — FLUCONAZOLE 150 MG PO TABS: ORAL_TABLET | ORAL | 0 refills | Status: AC

## 2022-01-29 NOTE — Addendum Note (Signed)
Addended by: Clayton Lefort on: 01/29/2022 12:18 PM ? ? Modules accepted: Orders ? ?

## 2022-02-11 ENCOUNTER — Other Ambulatory Visit: Payer: Medicaid Other

## 2022-02-11 ENCOUNTER — Encounter: Payer: Medicaid Other | Attending: Family Medicine | Admitting: Registered"

## 2022-02-11 ENCOUNTER — Other Ambulatory Visit: Payer: Self-pay

## 2022-02-11 ENCOUNTER — Ambulatory Visit (INDEPENDENT_AMBULATORY_CARE_PROVIDER_SITE_OTHER): Payer: Medicaid Other | Admitting: Registered"

## 2022-02-11 DIAGNOSIS — Z713 Dietary counseling and surveillance: Secondary | ICD-10-CM | POA: Diagnosis not present

## 2022-02-11 DIAGNOSIS — E1159 Type 2 diabetes mellitus with other circulatory complications: Secondary | ICD-10-CM | POA: Diagnosis present

## 2022-02-11 NOTE — Progress Notes (Signed)
Diabetes Self-Management Education ? ?Visit Type: First/Initial ? ?Appt. Start Time: 1630 Appt. End Time: 9242 ? ?02/18/2022 ? ?[name], identified by name and date of birth, is a 51 y.o. female with a diagnosis of Diabetes: Type 2.  ? ?ASSESSMENT ? ?There were no vitals taken for this visit. ?There is no height or weight on file to calculate BMI. ? ?A1c 6.5% on 05/02/21 ?Medication: Jardiance, pt states Rybelsus was sent to her pharmacy 2 weeks ago but she has a message that it is "Delayed- reviewing insurance"  ?(pt reports cannot tolerate Metformin) ? ?Pt reports continous yeast infection, was waiting for Dr. Harolyn [name], but saw Dr. Dione [name] and was told she needed to get blood sugar controlled ? ?Patient has not been checking her blood sugar recently. Pt phone app has some historical readings.  ? ?Pt brought glucometer to visit that was stored in a shed. When patient opening bag many small bugs came out of the bag. Pt also brought 2 dogs in a cat carrier to the appointment because she couldn't leave them alone at home. ? ?Pt reports she has a lot of pain L side of back and stomach and has been seen for it.  ? ?Pt states she has never received diabetes education and wants to understand how to eat and what to do. ? ? Diabetes Self-Management Education - 02/18/22 1456   ? ?  ? Visit Information  ? Visit Type First/Initial   ?  ? Initial Visit  ? Diabetes Type Type 2   ? Are you currently following a meal plan? No   ? Are you taking your medications as prescribed? Yes   ?  ? Complications  ? How often do you check your blood sugar? 0 times/day (not testing)   ? ?  ?  ? ?  ? ? ?Individualized Plan for Diabetes Self-Management Training:  ? ?Learning Objective:  Patient will have a greater understanding of diabetes self-management. ?Patient education plan is to attend individual and/or group sessions per assessed needs and concerns. ? ? ?Patient Instructions  ?Aim to eat balanced meals following MyPlate  as a guide. ?Throw out your glucometer because it has been contaminated with bugs ?A new prescription will sent to your pharmacy. ?Start checking your fasting blood sugar. ?Set up a MyChart video visit for a follow-up to discuss your blood sugar and diet. ? ?Expected Outcomes:    ? ?Education material provided: My Plate ? ?If problems or questions, patient to contact team via:  Phone and MyCart ? ? ? ?

## 2022-02-13 ENCOUNTER — Other Ambulatory Visit: Payer: Self-pay

## 2022-02-17 DIAGNOSIS — K219 Gastro-esophageal reflux disease without esophagitis: Secondary | ICD-10-CM | POA: Insufficient documentation

## 2022-02-17 DIAGNOSIS — E782 Mixed hyperlipidemia: Secondary | ICD-10-CM | POA: Insufficient documentation

## 2022-02-17 DIAGNOSIS — J410 Simple chronic bronchitis: Secondary | ICD-10-CM | POA: Insufficient documentation

## 2022-02-17 DIAGNOSIS — E119 Type 2 diabetes mellitus without complications: Secondary | ICD-10-CM | POA: Insufficient documentation

## 2022-02-18 ENCOUNTER — Encounter: Payer: Self-pay | Admitting: Allergy and Immunology

## 2022-02-18 ENCOUNTER — Other Ambulatory Visit: Payer: Self-pay

## 2022-02-18 ENCOUNTER — Ambulatory Visit: Payer: Medicaid Other | Admitting: Allergy and Immunology

## 2022-02-18 VITALS — BP 142/80 | HR 78 | Temp 97.9°F | Resp 16 | Ht 62.0 in | Wt 222.4 lb

## 2022-02-18 DIAGNOSIS — J3089 Other allergic rhinitis: Secondary | ICD-10-CM | POA: Diagnosis not present

## 2022-02-18 DIAGNOSIS — J449 Chronic obstructive pulmonary disease, unspecified: Secondary | ICD-10-CM | POA: Diagnosis not present

## 2022-02-18 DIAGNOSIS — F17210 Nicotine dependence, cigarettes, uncomplicated: Secondary | ICD-10-CM

## 2022-02-18 DIAGNOSIS — K219 Gastro-esophageal reflux disease without esophagitis: Secondary | ICD-10-CM | POA: Diagnosis not present

## 2022-02-18 DIAGNOSIS — G43909 Migraine, unspecified, not intractable, without status migrainosus: Secondary | ICD-10-CM

## 2022-02-18 MED ORDER — ESOMEPRAZOLE MAGNESIUM 40 MG PO CPDR: 40.0000 mg | DELAYED_RELEASE_CAPSULE | Freq: Two times a day (BID) | ORAL | 5 refills | Status: AC

## 2022-02-18 MED ORDER — ACCU-CHEK GUIDE VI STRP: ORAL_STRIP | 12 refills | Status: AC

## 2022-02-18 MED ORDER — TRIAMCINOLONE ACETONIDE 55 MCG/ACT NA AERO: 1.0000 | INHALATION_SPRAY | Freq: Two times a day (BID) | NASAL | 5 refills | Status: AC

## 2022-02-18 MED ORDER — ACCU-CHEK SOFTCLIX LANCETS MISC: 12 refills | Status: AC

## 2022-02-18 MED ORDER — BREZTRI AEROSPHERE 160-9-4.8 MCG/ACT IN AERO: 2.0000 | INHALATION_SPRAY | Freq: Two times a day (BID) | RESPIRATORY_TRACT | 5 refills | Status: AC

## 2022-02-18 MED ORDER — ACCU-CHEK GUIDE W/DEVICE KIT: 1.0000 | PACK | 0 refills | Status: AC | PRN

## 2022-02-18 MED ORDER — FAMOTIDINE 40 MG PO TABS: 40.0000 mg | ORAL_TABLET | Freq: Every day | ORAL | 5 refills | Status: AC

## 2022-02-18 NOTE — Patient Instructions (Signed)
?  1.  Return for skin testing without use of antihistamines for 3 days ? ?2.  Treat and prevent inflammation of airway: ? ?A.  Consolidate tobacco smoke exposure.  Consider nicotine substitutes ?Mayford Knife -2 inhalations twice a day with spacer (empty lungs) ?C.  OTC Nasacort -1 spray each nostril 1 time per day ? ?3.  Treat and prevent reflux/LPR: ? ?A.  Consolidate caffeine use ?B.  Nexium 40 mg -1 tablet twice a day ?C.  Famotidine 40 mg -1 tablet in evening ?D.  Obtain upper endoscopy 22 February 2022 ? ?4.  If needed: ? ?A.  Albuterol HFA -2 inhalations every 4-6 hours ?B.  Cetirizine 10 mg -1 tablet 1 time per day ?C.  Imitrex nasal 5 mg -use nasal spray at onset of migraine (#12) ? ?
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
## 2022-02-18 NOTE — Patient Instructions (Addendum)
Aim to eat balanced meals following MyPlate as a guide. ?Throw out your glucometer because it has been contaminated with bugs ?A new prescription will sent to your pharmacy. ?Start checking your fasting blood sugar. ?Set up a MyChart video visit for a follow-up to discuss your blood sugar and diet. ?

## 2022-02-19 ENCOUNTER — Encounter: Payer: Self-pay | Admitting: Allergy and Immunology

## 2022-02-19 ENCOUNTER — Other Ambulatory Visit: Payer: Self-pay | Admitting: *Deleted

## 2022-02-19 ENCOUNTER — Telehealth: Payer: Self-pay | Admitting: Allergy and Immunology

## 2022-02-19 MED ORDER — VENTOLIN HFA 108 (90 BASE) MCG/ACT IN AERS
2.0000 | INHALATION_SPRAY | RESPIRATORY_TRACT | 1 refills | Status: DC | PRN
Start: 2022-02-19 — End: 2022-03-27

## 2022-02-19 MED ORDER — CETIRIZINE HCL 10 MG PO TABS: 10.0000 mg | ORAL_TABLET | Freq: Every day | ORAL | 5 refills | Status: AC

## 2022-02-19 MED ORDER — SUMATRIPTAN 5 MG/ACT NA SOLN: 1.0000 | Freq: Two times a day (BID) | NASAL | 5 refills | Status: AC | PRN

## 2022-02-19 NOTE — Telephone Encounter (Signed)
Refills have been sent in. Called and left a detailed voicemail advising patient.  ?

## 2022-02-19 NOTE — Telephone Encounter (Signed)
Patient called stating that her prescriptions for Albuterol, Imitrex and Cetirizine was not called in to pharmacy, [facility] at [facility] in [place]. Patient requests call back at [phone] ?

## 2022-02-20 ENCOUNTER — Telehealth: Payer: Self-pay | Admitting: Allergy and Immunology

## 2022-02-20 ENCOUNTER — Other Ambulatory Visit: Payer: Self-pay | Admitting: *Deleted

## 2022-02-20 NOTE — Telephone Encounter (Signed)
Patient called and said that the nasnex was not called into [facility] [address] [phone]. ?

## 2022-02-20 NOTE — Telephone Encounter (Signed)
Called and left a voicemail asking for patient to return call to discuss. Per Dr. Bruna Potter note he stated for the patient to be on Nasacort which the generic Triamcinolone was sent to the pharmacy. It is over the counter and likely not covered by the patients Medicaid. Will speak with patient first and ask if she would like for me to try a prior authorization to see if I can get the nasal spray covered.  ?

## 2022-02-21 NOTE — Telephone Encounter (Signed)
Patient called back and was advised that her discharge summary said to take Nasacort over the counter. She stated that was not correct that you had put on her discharge summary Nasonex and a nurse gave her a coupon for Nasonex because we didn't have samples. She stated that she originally wanted Rhinocort but you told her that they didn't make it anymore. She wanted to check with you, I advised that you were out of office until Monday and she stated that she was fine with waiting.  ?

## 2022-02-24 ENCOUNTER — Other Ambulatory Visit: Payer: Self-pay | Admitting: *Deleted

## 2022-02-24 NOTE — Telephone Encounter (Signed)
Called and left a voicemail asking for patient to return call to discuss.  °

## 2022-02-25 NOTE — Telephone Encounter (Signed)
Would it be ok to send in Mometasone nasal spray? I might can get that covered with a PA.  ?

## 2022-02-25 NOTE — Telephone Encounter (Signed)
Patient states she can not afford anything otc due to her finances. She states she is already having to decide between bills and paying for medications. She is requesting a prescription that can be covered by Medicaid.  ? ? ?

## 2022-02-26 ENCOUNTER — Other Ambulatory Visit: Payer: Self-pay | Admitting: *Deleted

## 2022-02-26 MED ORDER — MOMETASONE FUROATE 50 MCG/ACT NA SUSP: 2.0000 | Freq: Every day | NASAL | 5 refills | Status: AC

## 2022-02-26 NOTE — Telephone Encounter (Signed)
PA has been approved for Mometasone nasal spray. PA has been faxed to patients pharmacy, labeled, and placed in bulk scanning. Called patient and advised. Patient verbalized understanding.  ?

## 2022-02-26 NOTE — Telephone Encounter (Signed)
Sent in new prescription for Mometasone nasal spray. PA has been submitted through CoverMyMeds for Mometasone and is currently pending approval/denial.  ?

## 2022-03-05 ENCOUNTER — Ambulatory Visit: Payer: Medicaid Other | Admitting: Family Medicine

## 2022-03-11 ENCOUNTER — Other Ambulatory Visit: Payer: Self-pay | Admitting: Family Medicine

## 2022-03-11 DIAGNOSIS — R300 Dysuria: Secondary | ICD-10-CM

## 2022-03-11 DIAGNOSIS — B3731 Acute candidiasis of vulva and vagina: Secondary | ICD-10-CM

## 2022-03-12 ENCOUNTER — Other Ambulatory Visit: Payer: Self-pay | Admitting: Family Medicine

## 2022-03-12 DIAGNOSIS — R300 Dysuria: Secondary | ICD-10-CM

## 2022-03-25 ENCOUNTER — Ambulatory Visit: Payer: Medicaid Other | Admitting: Allergy and Immunology

## 2022-03-27 ENCOUNTER — Other Ambulatory Visit: Payer: Self-pay | Admitting: Allergy and Immunology

## 2022-03-28 NOTE — Patient Instructions (Incomplete)
1.  Treat and prevent inflammation of airway: ? ?A.  Consolidate tobacco smoke exposure.  Consider nicotine substitutes ?Mayford Knife -2 inhalations twice a day with spacer (empty lungs) ?C.  mometasone -1 spray each nostril 1 time per day ? ?2.  Treat and prevent reflux/LPR: ? ?A.  Consolidate caffeine use ?B.  Nexium 40 mg -1 tablet twice a day ?C.  Famotidine 40 mg -1 tablet in evening ? ? ?3.  If needed: ? ?A.  Albuterol HFA -2 inhalations every 4-6 hours ?B.  Cetirizine 10 mg -1 tablet 1 time per day ?C.  Imitrex nasal 5 mg -use nasal spray at onset of migraine  ? ?

## 2022-03-31 ENCOUNTER — Ambulatory Visit: Payer: Medicaid Other | Admitting: Family

## 2022-04-14 ENCOUNTER — Ambulatory Visit: Payer: Medicaid Other | Admitting: Obstetrics & Gynecology

## 2022-04-15 ENCOUNTER — Other Ambulatory Visit: Payer: Self-pay | Admitting: Family

## 2022-05-20 ENCOUNTER — Ambulatory Visit: Payer: Medicaid Other | Admitting: Family Medicine

## 2022-06-16 ENCOUNTER — Ambulatory Visit: Payer: Medicaid Other | Admitting: Family

## 2023-01-03 ENCOUNTER — Other Ambulatory Visit: Payer: Self-pay | Admitting: Allergy and Immunology

## 2023-01-14 ENCOUNTER — Other Ambulatory Visit (HOSPITAL_COMMUNITY): Payer: Self-pay

## 2023-01-14 ENCOUNTER — Telehealth: Payer: Self-pay

## 2023-01-14 NOTE — Telephone Encounter (Signed)
PA request received via CMM for Breztri Aerosphere 160-9-4.8MCG/ACT aerosol  PA has been submitted to [employer] and has been APPROVED from 01/14/2023-01/14/2024  Key: [facility]

## 2023-03-17 ENCOUNTER — Telehealth: Payer: Self-pay

## 2023-03-17 NOTE — Telephone Encounter (Signed)
Received [facility] healthy blue communication for Dr Lucie Leather  reference #[id number] date of service 02/12/2023  fyi "patient with asthma may not be using a long-acting steroid r a nonsteroidak controller."

## 2024-06-02 ENCOUNTER — Other Ambulatory Visit: Payer: Self-pay

## 2024-06-02 ENCOUNTER — Encounter (HOSPITAL_COMMUNITY): Payer: Self-pay | Admitting: Emergency Medicine

## 2024-06-02 ENCOUNTER — Emergency Department (HOSPITAL_COMMUNITY)
Admission: EM | Admit: 2024-06-02 | Discharge: 2024-06-02 | Discharge status: Against Medical Advice | Attending: Emergency Medicine | Admitting: Emergency Medicine

## 2024-06-02 DIAGNOSIS — L0231 Cutaneous abscess of buttock: Secondary | ICD-10-CM | POA: Diagnosis present

## 2024-06-02 DIAGNOSIS — I10 Essential (primary) hypertension: Secondary | ICD-10-CM | POA: Insufficient documentation

## 2024-06-02 DIAGNOSIS — E119 Type 2 diabetes mellitus without complications: Secondary | ICD-10-CM | POA: Diagnosis not present

## 2024-06-02 DIAGNOSIS — Z5329 Procedure and treatment not carried out because of patient's decision for other reasons: Secondary | ICD-10-CM | POA: Diagnosis not present

## 2024-06-02 MED ORDER — LIDOCAINE-EPINEPHRINE (PF) 2 %-1:200000 IJ SOLN: 20.0000 mL | Freq: Once | INTRAMUSCULAR | Status: DC

## 2024-06-02 MED ORDER — MORPHINE SULFATE (PF) 4 MG/ML IV SOLN
4.0000 mg | Freq: Once | INTRAVENOUS | Status: DC
  Filled 2024-06-02: qty 1

## 2024-06-02 MED ORDER — LIDOCAINE-EPINEPHRINE (PF) 2 %-1:200000 IJ SOLN
20.0000 mL | Freq: Once | INTRAMUSCULAR | Status: DC
  Filled 2024-06-02: qty 20

## 2024-06-02 NOTE — ED Provider Notes (Signed)
.  Incision and Drainage  Date/Time: 06/02/2024 10:04 PM  Performed by: Myriam Dorn BROCKS, PA Authorized by: Myriam Dorn BROCKS, PA   Consent:    Consent obtained:  Verbal   Consent given by:  Patient   Risks, benefits, and alternatives were discussed: yes     Risks discussed:  Bleeding, pain, incomplete drainage, damage to other organs and infection   Alternatives discussed:  No treatment, delayed treatment, alternative treatment, observation and referral Universal protocol:    Procedure explained and questions answered to patient or proxy's satisfaction: yes     Patient identity confirmed:  Verbally with patient, hospital-assigned identification number and arm band Location:    Type:  Abscess   Size:  6 cm   Location:  Anogenital   Anogenital location:  Gluteal cleft Pre-procedure details:    Skin preparation:  Povidone-iodine Sedation:    Sedation type:  None Anesthesia:    Anesthesia method:  Local infiltration   Local anesthetic:  Lidocaine  2% WITH epi Procedure type:    Complexity:  Complex Procedure details:    Ultrasound guidance: no     Needle aspiration: no     Incision types:  Single straight   Incision depth:  Dermal   Wound management:  Probed and deloculated, irrigated with saline and extensive cleaning   Drainage:  Bloody and purulent   Drainage amount:  Moderate   Wound treatment:  Wound left open   Packing materials:  1/2 in iodoform gauze   Amount 1/2 iodoform:  10 cm Post-procedure details:    Procedure completion:  Tolerated well, no immediate complications     Myriam Dorn BROCKS, PA 06/02/24 [id number]    Randol Simmonds, MD 06/03/24 [phone]

## 2024-06-02 NOTE — ED Provider Notes (Signed)
 [place] EMERGENCY DEPARTMENT AT [facility] Provider Note   CSN: [id number] Arrival date & time: 06/02/24  2038     Patient presents with: Abscess   [name] [name] is a 53 y.o. female.    Abscess    Patient has a history of migraines, fibromyalgia, pneumonia, hypertension, diabetes, hyperlipidemia.  Patient states she has not seen a doctor in a while.  She has not been taking any medications for her diabetes.  She noticed several days ago a sore spot in her buttock.  Patient states she felt an abscess was developing.  She has been trying to express the wound to get the pus out.  Patient states she started noticing increasing drainage.  She has felt feverish.  No vomiting or diarrhea.  Prior to Admission medications   Medication Sig Start Date End Date Taking? Authorizing Provider  Accu-Chek Softclix Lancets lancets Use as instructed Patient not taking: Reported on 06/02/2024 02/18/22   Fredirick Glenys RAMAN, MD  albuterol  (PROVENTIL  HFA;VENTOLIN  HFA) 108 (90 BASE) MCG/ACT inhaler Inhale 2 puffs into the lungs every 4 (four) hours as needed. For shortness of breath Patient not taking: Reported on 06/02/2024    [provider]  amLODipine  (NORVASC ) 5 MG tablet Take 5 mg by mouth daily. Patient not taking: Reported on 06/02/2024 01/15/22   [provider]  amphetamine-dextroamphetamine (ADDERALL XR) 20 MG 24 hr capsule Take 20 mg by mouth daily as needed. Patient not taking: Reported on 06/02/2024 05/21/22   [provider]  atorvastatin  (LIPITOR) 10 MG tablet Take 10 mg by mouth daily. Patient not taking: Reported on 05/03/2021 01/27/21   [provider]  Blood Glucose Monitoring Suppl (ACCU-CHEK GUIDE) w/Device KIT 1 Device by Does not apply route as needed. Patient not taking: Reported on 06/02/2024 02/18/22   Fredirick Glenys RAMAN, MD  Budeson-Glycopyrrol-Formoterol  (BREZTRI  AEROSPHERE) 160-9-4.8 MCG/ACT AERO Inhale 2 puffs into the lungs in the  morning and at bedtime. Use with spacer. Patient not taking: Reported on 06/02/2024 02/18/22   Kozlow, Eric J, MD  BUTRANS 5 MCG/HR PTWK 1 patch once a week. Patient not taking: Reported on 02/18/2022 12/05/21   [provider]  cetirizine  (ZYRTEC ) 10 MG tablet Take 1 tablet (10 mg total) by mouth daily. Patient not taking: Reported on 06/02/2024 02/19/22   Kozlow, Eric J, MD  diazepam  (VALIUM ) 5 MG tablet Take 5 mg by mouth daily as needed. Patient not taking: Reported on 06/02/2024 05/21/22   [provider]  diclofenac  (FLECTOR) 1.3 % PTCH SMARTSIG:1 Patch(s) Topical Twice Daily PRN Patient not taking: Reported on 06/02/2024 01/11/22   [provider]  diclofenac  Sodium (VOLTAREN ) 1 % GEL Apply 2-4 g topically 4 (four) times daily as needed. Back pain Patient not taking: Reported on 06/02/2024 04/10/21   [provider]  esomeprazole  (NEXIUM ) 40 MG capsule Take 1 capsule (40 mg total) by mouth in the morning and at bedtime. 40mg  by mouth daily And 40mg  daily if needed for indigestion Patient not taking: Reported on 06/02/2024 02/18/22   Kozlow, Eric J, MD  famotidine  (PEPCID ) 40 MG tablet Take 1 tablet (40 mg total) by mouth daily. Patient not taking: Reported on 06/02/2024 02/18/22   Kozlow, Eric J, MD  fluconazole  (DIFLUCAN ) 150 MG tablet Take 1 tablet every 72 hours for three doses. Then take 1 tablet once a week until bottle is finished Patient not taking: Reported on 06/02/2024 01/29/22   Lola Donnice HERO, MD  fluticasone  (FLONASE ) 50 MCG/ACT  nasal spray Place 1-2 sprays into both nostrils daily. Patient not taking: Reported on 06/02/2024    [provider]  fluvoxaMINE (LUVOX) 100 MG tablet Take 150 mg by mouth daily. Patient not taking: Reported on 06/02/2024 05/21/22   [provider]  glucose blood (ACCU-CHEK GUIDE) test strip Use as instructed Patient not taking: Reported on 06/02/2024 02/18/22   Fredirick Glenys RAMAN, MD  guaiFENesin  (ROBITUSSIN) 100  MG/5ML SOLN Take 5 mLs (100 mg total) by mouth every 4 (four) hours as needed for cough or to loosen phlegm. Patient not taking: Reported on 06/02/2024 05/04/21   Krishnan, Gokul, MD  JARDIANCE 10 MG TABS tablet Take 10 mg by mouth daily. Patient not taking: Reported on 06/02/2024 01/17/22   [provider]  lamoTRIgine (LAMICTAL) 200 MG tablet Take 200 mg by mouth daily. Patient not taking: Reported on 06/02/2024 10/19/21   [provider]  lisinopril (ZESTRIL) 10 MG tablet Take 10 mg by mouth daily. Patient not taking: Reported on 06/02/2024 02/13/21   [provider]  losartan  (COZAAR ) 50 MG tablet Take 50 mg by mouth daily. Patient not taking: Reported on 06/02/2024 01/14/22   [provider]  metFORMIN  (GLUCOPHAGE ) 500 MG tablet Take 500 mg by mouth 2 (two) times daily. Patient not taking: Reported on 06/02/2024 01/28/21   [provider]  methocarbamol  (ROBAXIN ) 500 MG tablet Take 1 tablet (500 mg total) by mouth every 8 (eight) hours as needed for muscle spasms. Patient not taking: Reported on 06/02/2024 10/02/20   Dawley, Troy C, DO  mometasone  (NASONEX ) 50 MCG/ACT nasal spray Place 2 sprays into the nose daily. Patient not taking: Reported on 06/02/2024 02/26/22   Kozlow, Eric J, MD  mupirocin  ointment (BACTROBAN ) 2 % Apply topically 2 (two) times daily as needed. Patient not taking: Reported on 06/02/2024 11/27/21   [provider]  nystatin  (MYCOSTATIN ) 100000 UNIT/ML suspension SMARTSIG:1 Milliliter(s) By Mouth 3 Times Daily PRN Patient not taking: Reported on 06/02/2024 01/22/22   [provider]  NYSTATIN  powder SMARTSIG:1 Topical Daily Patient not taking: Reported on 06/02/2024 01/13/22   [provider]  omeprazole (PRILOSEC) 40 MG capsule Take 40 mg by mouth daily. Patient not taking: Reported on 06/02/2024 01/13/22   [provider]  oxycodone  (ROXICODONE ) 30 MG immediate release tablet Take 30 mg by mouth 5 (five) times  daily. Patient not taking: Reported on 06/02/2024    [provider]  phenazopyridine  (PYRIDIUM ) 200 MG tablet Take 1 tablet (200 mg total) by mouth 3 (three) times daily as needed for pain. Patient not taking: Reported on 06/02/2024 01/28/22   Lola Donnice HERO, MD  Phenylephrine -Acetaminophen  (SINUS PRESSURE + PAIN) 5-325 MG TABS Take 2 tablets by mouth 2 (two) times daily as needed (congestion). Patient not taking: Reported on 06/02/2024    [provider]  pravastatin  (PRAVACHOL ) 10 MG tablet Take 10 mg by mouth daily. Patient not taking: Reported on 06/02/2024 11/02/21   [provider]  Pseudoephedrine -APAP-DM (DAYQUIL PO) Take 2 capsules by mouth 2 (two) times daily as needed (congestion). Patient not taking: Reported on 06/02/2024    [provider]  RYBELSUS 3 MG TABS Take 1 tablet by mouth daily. Patient not taking: Reported on 06/02/2024 05/13/22   [provider]  SUMAtriptan  (IMITREX ) 5 MG/ACT nasal spray Place 1 spray (5 mg total) into the nose 2 (two) times daily as needed for migraine. Patient not taking: Reported on 06/02/2024 02/19/22   Kozlow, Eric J, MD  tiZANidine  (ZANAFLEX )  4 MG tablet Take 4 mg by mouth 3 (three) times daily. Patient not taking: Reported on 06/02/2024 09/16/21   [provider]  triamcinolone  (NASACORT ) 55 MCG/ACT AERO nasal inhaler Place 1 spray into the nose 2 (two) times daily. Patient not taking: Reported on 06/02/2024 02/18/22   Kozlow, Eric J, MD  VENTOLIN  HFA 108 (90 Base) MCG/ACT inhaler INHALE 2 PUFFS INTO THE LUNGS EVERY 4 HOURS AS NEEDED FOR WHEEZING OR SHORTNESS OF BREATH Patient not taking: Reported on 06/02/2024 01/05/23   Kozlow, Eric J, MD  Vitamin D, Ergocalciferol, (DRISDOL) 1.25 MG (50000 UNIT) CAPS capsule Take 50,000 Units by mouth once a week. Patient not taking: Reported on 06/02/2024 04/13/21   [provider]    Allergies: Bee venom, Egg-derived products, Nucynta [tapentadol],  Mushroom extract complex (obsolete), Other, Symbicort  [budesonide -formoterol  fumarate], Aspirin, and Nsaids    Review of Systems  Updated Vital Signs BP (!) 172/89   Pulse 90   Temp 98.7 F (37.1 C)   Resp 20   SpO2 99%   Physical Exam Vitals and nursing note reviewed.  Constitutional:      Appearance: She is well-developed. She is diaphoretic.  HENT:     Head: Normocephalic and atraumatic.     Right Ear: External ear normal.     Left Ear: External ear normal.  Eyes:     General: No scleral icterus.       Right eye: No discharge.        Left eye: No discharge.     Conjunctiva/sclera: Conjunctivae normal.  Neck:     Trachea: No tracheal deviation.  Cardiovascular:     Rate and Rhythm: Normal rate and regular rhythm.  Pulmonary:     Effort: Pulmonary effort is normal. No respiratory distress.     Breath sounds: Normal breath sounds. No stridor. No wheezing or rales.  Abdominal:     General: Bowel sounds are normal. There is no distension.     Palpations: Abdomen is soft.     Tenderness: There is no abdominal tenderness. There is no guarding or rebound.  Genitourinary:    Comments: Lesion right upper buttock, surrounding area erythema and induration, small amount of purulent drainage Musculoskeletal:        General: No tenderness or deformity.     Cervical back: Neck supple.  Skin:    General: Skin is warm.     Findings: No rash.  Neurological:     General: No focal deficit present.     Mental Status: She is alert.     Cranial Nerves: No cranial nerve deficit, dysarthria or facial asymmetry.     Sensory: No sensory deficit.     Motor: No abnormal muscle tone or seizure activity.     Coordination: Coordination normal.  Psychiatric:        Mood and Affect: Mood normal.     (all labs ordered are listed, but only abnormal results are displayed) Labs Reviewed  CBC  BASIC METABOLIC PANEL WITH GFR  I-STAT CG4 LACTIC ACID, ED  I-STAT CG4 LACTIC ACID, ED     EKG: None  Radiology: No results found.   Procedures   Medications Ordered in the ED  lidocaine -EPINEPHrine  (XYLOCAINE  W/EPI) 2 %-1:200000 (PF) injection 20 mL (has no administration in time range)  morphine  (PF) 4 MG/ML injection 4 mg (has no administration in time range)  Medical Decision Making Problems Addressed: Abscess of buttock, right: acute illness or injury that poses a threat to life or bodily functions  Amount and/or Complexity of Data Reviewed Labs: ordered.  Risk Prescription drug management.   Patient presented to the ED for evaluation of increasing swelling and drainage from probable abscess on her buttock.  Patient had an I&D procedure performed by PA Gilliam.  Laboratory test were ordered including CBC metabolic panel lactic acid level.  Patient ended up eloping after her I&D procedure.  The laboratory tests were not performed and she did not receive any prescriptions or discharge instructions.     Final diagnoses:  Abscess of buttock, right    ED Discharge Orders     None          Randol Simmonds, MD 06/03/24 [phone]

## 2024-06-02 NOTE — ED Triage Notes (Signed)
 Pt presents with abscess to backside, draining greenish discharge.  Redness noted to surrounding area.  Pt has tried to self treat without success.

## 2024-06-02 NOTE — ED Notes (Signed)
 Pt's cell phone called and left VM instructing she needed to come back to have her IV removed and/or complete treatment
